# Patient Record
Sex: Male | Born: 1990
Health system: Southern US, Community
[De-identification: ages and names within clinical notes are randomized; demographics above are authoritative.]

---
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
## 2020-04-23 ENCOUNTER — Other Ambulatory Visit: Payer: Self-pay

## 2020-04-23 ENCOUNTER — Emergency Department (HOSPITAL_BASED_OUTPATIENT_CLINIC_OR_DEPARTMENT_OTHER): Payer: 59

## 2020-04-23 ENCOUNTER — Encounter (HOSPITAL_BASED_OUTPATIENT_CLINIC_OR_DEPARTMENT_OTHER): Payer: Self-pay

## 2020-04-23 DIAGNOSIS — R0789 Other chest pain: Secondary | ICD-10-CM | POA: Diagnosis not present

## 2020-04-23 LAB — BASIC METABOLIC PANEL
Anion gap: 12 (ref 5–15)
BUN: 15 mg/dL (ref 6–20)
CO2: 25 mmol/L (ref 22–32)
Calcium: 9.4 mg/dL (ref 8.9–10.3)
Chloride: 102 mmol/L (ref 98–111)
Creatinine, Ser: 1.13 mg/dL (ref 0.61–1.24)
GFR calc Af Amer: >60 mL/min (ref >60)
GFR calc non Af Amer: >60 mL/min (ref >60)
Glucose, Bld: 140 mg/dL — ABNORMAL HIGH (ref 70–99)
Potassium: 3.4 mmol/L — ABNORMAL LOW (ref 3.5–5.1)
Sodium: 139 mmol/L (ref 135–145)

## 2020-04-23 LAB — CBC
HCT: 46.3 % (ref 39.0–52.0)
Hemoglobin: 15.2 g/dL (ref 13.0–17.0)
MCH: 26.5 pg (ref 26.0–34.0)
MCHC: 32.8 g/dL (ref 30.0–36.0)
MCV: 80.8 fL (ref 80.0–100.0)
Platelets: 229 10*3/uL (ref 150–400)
RBC: 5.73 MIL/uL (ref 4.22–5.81)
RDW: 14.6 % (ref 11.5–15.5)
WBC: 7.5 10*3/uL (ref 4.0–10.5)
nRBC: 0 % (ref 0.0–0.2)

## 2020-04-23 MED ORDER — SODIUM CHLORIDE 0.9% FLUSH
3.0000 mL | Freq: Once | INTRAVENOUS | Status: DC
  Filled 2020-04-23: qty 3

## 2020-04-23 NOTE — ED Triage Notes (Signed)
Pt c/o CP x 4 days-denies fever/flu like sx-NAD-steady gait

## 2020-04-23 NOTE — ED Notes (Signed)
Patient transported to X-ray 

## 2020-04-24 ENCOUNTER — Emergency Department (HOSPITAL_BASED_OUTPATIENT_CLINIC_OR_DEPARTMENT_OTHER)
Admission: EM | Admit: 2020-04-24 | Discharge: 2020-04-24 | Disposition: A | Discharge status: Home / Self Care | Payer: 59 | Attending: Emergency Medicine | Admitting: Emergency Medicine

## 2020-04-24 DIAGNOSIS — R0789 Other chest pain: Secondary | ICD-10-CM

## 2020-04-24 LAB — D-DIMER, QUANTITATIVE: D-Dimer, Quant: <0.27 ug/mL-FEU (ref 0.00–0.50)

## 2020-04-24 LAB — LIPASE, BLOOD: Lipase: 25 U/L (ref 11–51)

## 2020-04-24 LAB — TROPONIN I (HIGH SENSITIVITY)
Troponin I (High Sensitivity): 2 ng/L (ref <18)
Troponin I (High Sensitivity): 2 ng/L (ref <18)

## 2020-04-24 LAB — HEPATIC FUNCTION PANEL
ALT: 53 U/L — ABNORMAL HIGH (ref 0–44)
AST: 32 U/L (ref 15–41)
Albumin: 4.8 g/dL (ref 3.5–5.0)
Alkaline Phosphatase: 58 U/L (ref 38–126)
Bilirubin, Direct: <0.1 mg/dL (ref 0.0–0.2)
Total Bilirubin: 0.6 mg/dL (ref 0.3–1.2)
Total Protein: 8.4 g/dL — ABNORMAL HIGH (ref 6.5–8.1)

## 2020-04-24 MED ORDER — NAPROXEN 500 MG PO TABS: 500.0000 mg | ORAL_TABLET | Freq: Two times a day (BID) | ORAL | 0 refills | Status: DC | PRN

## 2020-04-24 MED ORDER — LIDOCAINE VISCOUS HCL 2 % MT SOLN
15.0000 mL | Freq: Once | OROMUCOSAL | Status: AC
  Administered 2020-04-24: 15 mL via ORAL
  Filled 2020-04-24: qty 15

## 2020-04-24 MED ORDER — OMEPRAZOLE 20 MG PO CPDR: 20.0000 mg | DELAYED_RELEASE_CAPSULE | Freq: Every day | ORAL | 0 refills | Status: DC

## 2020-04-24 MED ORDER — ALUM & MAG HYDROXIDE-SIMETH 200-200-20 MG/5ML PO SUSP
30.0000 mL | Freq: Once | ORAL | Status: AC
  Administered 2020-04-24: 30 mL via ORAL
  Filled 2020-04-24: qty 30

## 2020-04-24 MED ORDER — KETOROLAC TROMETHAMINE 30 MG/ML IJ SOLN
30.0000 mg | Freq: Once | INTRAMUSCULAR | Status: AC
  Administered 2020-04-24: 30 mg via INTRAMUSCULAR
  Filled 2020-04-24: qty 1

## 2020-04-24 NOTE — ED Provider Notes (Signed)
[facility] EMERGENCY DEPARTMENT Provider Note   CSN: [id number] Arrival date & time: 04/23/20  2252     History Chief Complaint  Patient presents with  . Chest Pain    Devin Gibbs is a 29 y.o. male.  Patient with no medical history presenting with a 3-day history of constant chest pain is progressively worsening.  States the pain became more severe around 9 PM tonight which led to his ED visit.  The pain is in the center of his chest and epigastrium radiates to his back.  It is worse with palpation.  He has not had this kind of pain in the past.  He denies any shortness of breath, nausea, vomiting, diaphoresis.  Denies any abdominal pain.  No history of ulcers or acid reflux.  No cardiac history.  No pain with urination or blood in the urine. He has not tried any medication at home.  The pain is worse with palpation.  Not worse with movement.  No recent viral illness or fever.  The history is provided by the patient.  Chest Pain Associated symptoms: back pain   Associated symptoms: no abdominal pain, no dizziness, no fever, no headache, no nausea, no shortness of breath and no vomiting        History reviewed. No pertinent past medical history.  There are no problems to display for this patient.   History reviewed. No pertinent surgical history.     No family history on file.  Social History   Tobacco Use  . Smoking status: Never Smoker  . Smokeless tobacco: Never Used  Substance Use Topics  . Alcohol use: Yes    Comment: weekly  . Drug use: Never    Home Medications Prior to Admission medications   Not on File    Allergies    Shellfish allergy  Review of Systems   Review of Systems  Constitutional: Negative for activity change, appetite change and fever.  HENT: Negative for congestion and rhinorrhea.   Eyes: Negative for visual disturbance.  Respiratory: Positive for chest tightness. Negative for shortness of breath.   Cardiovascular:  Positive for chest pain.  Gastrointestinal: Negative for abdominal pain, nausea and vomiting.  Genitourinary: Negative for dysuria.  Musculoskeletal: Positive for back pain. Negative for arthralgias and myalgias.  Skin: Negative for rash.  Neurological: Negative for dizziness and headaches.   all other systems are negative except as noted in the HPI and PMH.    Physical Exam Updated Vital Signs BP 130/77   Pulse 75   Temp 98.7 F (37.1 C) (Oral)   Resp 18   Ht 5\' 7"  (1.702 m)   Wt 85.3 kg   SpO2 99%   BMI 29.44 kg/m   Physical Exam Vitals and nursing note reviewed.  Constitutional:      General: He is not in acute distress.    Appearance: He is well-developed. He is obese.  HENT:     Head: Normocephalic and atraumatic.     Mouth/Throat:     Pharynx: No oropharyngeal exudate.  Eyes:     Conjunctiva/sclera: Conjunctivae normal.     Pupils: Pupils are equal, round, and reactive to light.  Neck:     Comments: No meningismus. Cardiovascular:     Rate and Rhythm: Normal rate and regular rhythm.     Heart sounds: Normal heart sounds. No murmur.  Pulmonary:     Effort: Pulmonary effort is normal. No respiratory distress.     Breath sounds: Normal breath sounds.  Comments: Xiphoid tenderness Chest:     Chest wall: Tenderness present.  Abdominal:     Palpations: Abdomen is soft.     Tenderness: There is abdominal tenderness. There is no guarding or rebound.     Comments: Mild epigastric tender No RUQ tenderness. No guarding or rebound  Musculoskeletal:        General: No tenderness. Normal range of motion.     Cervical back: Normal range of motion and neck supple.     Comments: Paraspinal thoracic tenderness, no midline tenderness  Skin:    General: Skin is warm.     Capillary Refill: Capillary refill takes less than 2 seconds.  Neurological:     General: No focal deficit present.     Mental Status: He is alert and oriented to person, place, and time. Mental  status is at baseline.     Cranial Nerves: No cranial nerve deficit.     Motor: No abnormal muscle tone.     Coordination: Coordination normal.     Comments: No ataxia on finger to nose bilaterally. No pronator drift. 5/5 strength throughout. CN 2-12 intact.Equal grip strength. Sensation intact.   Psychiatric:        Behavior: Behavior normal.     ED Results / Procedures / Treatments   Labs (all labs ordered are listed, but only abnormal results are displayed) Labs Reviewed  BASIC METABOLIC PANEL - Abnormal; Notable for the following components:      Result Value   Potassium 3.4 (*)    Glucose, Bld 140 (*)    All other components within normal limits  HEPATIC FUNCTION PANEL - Abnormal; Notable for the following components:   Total Protein 8.4 (*)    ALT 53 (*)    All other components within normal limits  CBC  LIPASE, BLOOD  D-DIMER, QUANTITATIVE (NOT AT [facility])  TROPONIN I (HIGH SENSITIVITY)  TROPONIN I (HIGH SENSITIVITY)    EKG EKG Interpretation  Date/Time:  Monday Apr 23 2020 22:56:50 EDT Ventricular Rate:  109 PR Interval:  162 QRS Duration: 84 QT Interval:  308 QTC Calculation: 414 R Axis:   66 Text Interpretation: Sinus tachycardia Nonspecific T wave abnormality Abnormal ECG No previous ECGs available Confirmed by Glynn Octave [phone]) on 04/24/2020 12:22:32 AM   Radiology DG Chest 2 View  Result Date: 04/23/2020 CLINICAL DATA:  Chest pain EXAM: CHEST - 2 VIEW COMPARISON:  None. FINDINGS: The heart size and mediastinal contours are within normal limits. Both lungs are clear. The visualized skeletal structures are unremarkable. IMPRESSION: No active cardiopulmonary disease. Electronically Signed   By: Jonna Clark M.D.   On: 04/23/2020 23:08    Procedures Procedures (including critical care time)  Medications Ordered in ED Medications  sodium chloride flush (NS) 0.9 % injection 3 mL (has no administration in time range)  alum & mag hydroxide-simeth  (MAALOX/MYLANTA) 200-200-20 MG/5ML suspension 30 mL (30 mLs Oral Given 04/24/20 0114)    And  lidocaine (XYLOCAINE) 2 % viscous mouth solution 15 mL (15 mLs Oral Given 04/24/20 0115)  ketorolac (TORADOL) 30 MG/ML injection 30 mg (30 mg Intramuscular Given 04/24/20 0122)    ED Course  I have reviewed the triage vital signs and the nursing notes.  Pertinent labs & imaging results that were available during my care of the patient were reviewed by me and considered in my medical decision making (see chart for details).    MDM Rules/Calculators/A&P  4 days of constant chest pain that became acutely worse tonight.  It is worse with palpation and somewhat reproducible.  EKG shows no acute ischemia.  Troponin negative, chest x-ray negative.  Patient's pain is reproducible and worse with palpation.  There is no fever or recent illness.  Does not worsen with positional change.  Labs show negative troponin x2, normal LFTs, lipase and D-dimer.  Low suspicion for ACS or PE. Equal upper extremity blood pressures.  Negative D-dimer.  Low suspicion for aortic dissection.  We will treat with anti-inflammatories as well as prophylactic PPI.  Avoid alcohol, spicy foods, caffeine.  Establish care with PCP.  Return to the ED if chest pain becomes exertional, associated shortness of breath, nausea, vomiting, sweating, other concerns. Final Clinical Impression(s) / ED Diagnoses Final diagnoses:  Atypical chest pain    Rx / DC Orders ED Discharge Orders    None       Virgilio Broadhead, Annie Main, MD 04/24/20 [phone]

## 2020-04-24 NOTE — ED Notes (Signed)
Pt given a warm blanket x2

## 2020-04-24 NOTE — Discharge Instructions (Signed)
There is no evidence of heart attack or blood clot in the lung.  Take the anti-inflammatory as prescribed.  Avoid alcohol, caffeine, spicy foods.  Return to the ED for chest pain becomes exertional, associated shortness of breath, nausea, vomiting, sweating, any other concerns.

## 2020-09-27 DIAGNOSIS — Z20822 Contact with and (suspected) exposure to covid-19: Secondary | ICD-10-CM | POA: Diagnosis not present

## 2020-11-11 DIAGNOSIS — Z20822 Contact with and (suspected) exposure to covid-19: Secondary | ICD-10-CM | POA: Diagnosis not present

## 2021-02-11 DIAGNOSIS — Z20822 Contact with and (suspected) exposure to covid-19: Secondary | ICD-10-CM | POA: Diagnosis not present

## 2021-04-20 DIAGNOSIS — S239XXA Sprain of unspecified parts of thorax, initial encounter: Secondary | ICD-10-CM | POA: Diagnosis not present

## 2021-04-22 DIAGNOSIS — Z20822 Contact with and (suspected) exposure to covid-19: Secondary | ICD-10-CM | POA: Diagnosis not present

## 2021-05-02 ENCOUNTER — Encounter: Payer: Self-pay | Admitting: Medical

## 2021-05-02 ENCOUNTER — Ambulatory Visit: Payer: BLUE CROSS/BLUE SHIELD | Admitting: Medical

## 2021-05-02 ENCOUNTER — Other Ambulatory Visit: Payer: Self-pay

## 2021-05-02 VITALS — BP 112/78 | HR 84 | Ht 66.25 in | Wt 207.0 lb

## 2021-05-02 DIAGNOSIS — Z1159 Encounter for screening for other viral diseases: Secondary | ICD-10-CM

## 2021-05-02 DIAGNOSIS — Z13220 Encounter for screening for lipoid disorders: Secondary | ICD-10-CM | POA: Diagnosis not present

## 2021-05-02 DIAGNOSIS — Z6830 Body mass index (BMI) 30.0-30.9, adult: Secondary | ICD-10-CM

## 2021-05-02 DIAGNOSIS — R040 Epistaxis: Secondary | ICD-10-CM | POA: Diagnosis not present

## 2021-05-02 DIAGNOSIS — Z23 Encounter for immunization: Secondary | ICD-10-CM | POA: Diagnosis not present

## 2021-05-02 DIAGNOSIS — K219 Gastro-esophageal reflux disease without esophagitis: Secondary | ICD-10-CM

## 2021-05-02 DIAGNOSIS — Z0000 Encounter for general adult medical examination without abnormal findings: Secondary | ICD-10-CM

## 2021-05-02 DIAGNOSIS — R0789 Other chest pain: Secondary | ICD-10-CM

## 2021-05-02 DIAGNOSIS — Z113 Encounter for screening for infections with a predominantly sexual mode of transmission: Secondary | ICD-10-CM

## 2021-05-02 LAB — COMPREHENSIVE METABOLIC PANEL

## 2021-05-02 LAB — POCT URINALYSIS DIP (PROADVANTAGE DEVICE)
Bilirubin, UA: NEGATIVE
Blood, UA: NEGATIVE
Glucose, UA: NEGATIVE mg/dL
Ketones, POC UA: NEGATIVE mg/dL
Leukocytes, UA: NEGATIVE
Nitrite, UA: NEGATIVE
Protein Ur, POC: NEGATIVE mg/dL
Specific Gravity, Urine: 1.025
Urobilinogen, Ur: 0.2
pH, UA: 6 (ref 5.0–8.0)

## 2021-05-02 LAB — CBC WITH DIFFERENTIAL/PLATELET: Neutrophils Absolute: 1.9 10*3/uL (ref 1.4–7.0)

## 2021-05-02 NOTE — Progress Notes (Signed)
Subjective:   HPI  Devin Gibbs is a 30 y.o. male who presents for Chief Complaint  Patient presents with  . New Patient (Initial Visit)    Nose bleeds and headaches   Here as a new patient today accompanied by his male significant other  Here to establish care and for physical  He has had intermittent nosebleeds over the past several months.  They are infrequent, usually stops fairly quickly but still getting them somewhat regularly.  At times feels a little pressure in his chest.  He has gained weight and thinks is related to his weight gain.  He does exercise some.  Reviewed their medical, surgical, family, social, medication, and allergy history and updated chart as appropriate.  No past medical history on file.  No past surgical history on file.  No family history on file.  No current outpatient medications on file.  Allergies  Allergen Reactions  . Shellfish Allergy Anaphylaxis     Review of Systems Constitutional: -fever, -chills, -sweats, -unexpected weight change, -decreased appetite, -fatigue Allergy: -sneezing, -itching, -congestion Dermatology: -changing moles, --rash, -lumps ENT: -runny nose, -ear pain, -sore throat, -hoarseness, -sinus pain, -teeth pain, - ringing in ears, -hearing loss, +nosebleeds Cardiology: -chest pain, -palpitations, -swelling, -difficulty breathing when lying flat, -waking up short of breath Respiratory: -cough, -shortness of breath, -difficulty breathing with exercise or exertion, -wheezing, -coughing up blood Gastroenterology: -abdominal pain, -nausea, -vomiting, -diarrhea, -constipation, -blood in stool, -changes in bowel movement, -difficulty swallowing or eating Hematology: -bleeding, -bruising  Musculoskeletal: -joint aches, -muscle aches, -joint swelling, -back pain, -neck pain, -cramping, -changes in gait Ophthalmology: denies vision changes, eye redness, itching, discharge Urology: -burning with urination, -difficulty  urinating, -blood in urine, -urinary frequency, -urgency, -incontinence Neurology: -headache, -weakness, -tingling, -numbness, -memory loss, -falls, -dizziness Psychology: -depressed mood, -agitation, -sleep problems Male GU: no testicular mass, pain, no lymph nodes swollen, no swelling, no rash.  Depression screen [facility] 2/9 05/02/2021  Decreased Interest 0  Down, Depressed, Hopeless 0  PHQ - 2 Score 0        Objective:  BP 112/78   Pulse 84   Ht 5' 6.25" (1.683 m)   Wt 207 lb (93.9 kg)   SpO2 97%   BMI 33.16 kg/m   General appearance: alert, no distress, WD/WN, African American male Skin: Unremarkable HEENT: normocephalic, conjunctiva/corneas normal, sclerae anicteric, PERRLA, EOMi, nares patent, no discharge or erythema, pharynx normal Oral cavity: MMM, tongue normal, teeth normal Neck: supple, no lymphadenopathy, no thyromegaly, no masses, normal ROM, no bruits Chest: non tender, normal shape and expansion Heart: RRR, normal S1, S2, no murmurs Lungs: CTA bilaterally, no wheezes, rhonchi, or rales Abdomen: +bs, soft, non tender, non distended, no masses, no hepatomegaly, no splenomegaly, no bruits Back: non tender, normal ROM, no scoliosis Musculoskeletal: upper extremities non tender, no obvious deformity, normal ROM throughout, lower extremities non tender, no obvious deformity, normal ROM throughout Extremities: no edema, no cyanosis, no clubbing Pulses: 2+ symmetric, upper and lower extremities, normal cap refill Neurological: alert, oriented x 3, CN2-12 intact, strength normal upper extremities and lower extremities, sensation normal throughout, DTRs 2+ throughout, no cerebellar signs, gait normal Psychiatric: normal affect, behavior normal, pleasant  GU: normal male external genitalia,circumcised, nontender, no masses, no hernia, no lymphadenopathy Rectal: Deferred   Assessment and Plan :   Encounter Diagnoses  Name Primary?  . Encounter for health maintenance  examination in adult Yes  . Nosebleed   . Gastroesophageal reflux disease, unspecified whether esophagitis present   .  BMI 30.0-30.9,adult   . Screening for lipid disorders   . Screen for STD (sexually transmitted disease)   . Encounter for hepatitis C screening test for low risk patient   . Chest pressure   . Need for Tdap vaccination     This visit was a preventative care visit, also known as wellness visit or routine physical.   Topics typically include healthy lifestyle, diet, exercise, preventative care, vaccinations, sick and well care, proper use of emergency dept and after hours care, as well as other concerns.     Recommendations: Continue to return yearly for your annual wellness and preventative care visits.  This gives [place] a chance to discuss healthy lifestyle, exercise, vaccinations, review your chart record, and perform screenings where appropriate.  I recommend you see your eye doctor yearly for routine vision care.  I recommend you see your dentist yearly for routine dental care including hygiene visits twice yearly.   Vaccination recommendations were reviewed Immunization History  Administered Date(s) Administered  . PFIZER(Purple Top)SARS-COV-2 Vaccination 07/18/2020, 08/07/2020, 01/19/2021    Counseled on the Tdap (tetanus, diptheria, and acellular pertussis) vaccine.  Vaccine information sheet given. Tdap vaccine given after consent obtained.  Advised yearly flu shot in the fall    Screening for cancer: Colon cancer screening: Age 44   Testicular cancer screening You should do a monthly self testicular exam if you are between 57-67 years old  We discussed PSA, prostate exam, and prostate cancer screening risks/benefits.   Age 64  Skin cancer screening: Check your skin regularly for new changes, growing lesions, or other lesions of concern Come in for evaluation if you have skin lesions of concern.  Lung cancer screening: If you have a greater than  20 pack year history of tobacco use, then you may qualify for lung cancer screening with a chest CT scan.   Please call your insurance company to inquire about coverage for this test.  We currently don't have screenings for other cancers besides breast, cervical, colon, and lung cancers.  If you have a strong family history of cancer or have other cancer screening concerns, please let me know.    Bone health: Get at least 150 minutes of aerobic exercise weekly Get weight bearing exercise at least once weekly Bone density test:   A bone density test is an imaging test that uses a type of X-ray to measure the amount of calcium and other minerals in your bones.  The test may be used to diagnose or screen you for a condition that causes weak or thin bones (osteoporosis), predict your risk for a broken bone (fracture), or determine how well your osteoporosis treatment is working. The bone density test is recommended for females 65 and older, or females or males <65 if certain risk factors such as thyroid disease, long term use of steroids such as for asthma or rheumatological issues, vitamin D deficiency, estrogen deficiency, family history of osteoporosis, self or family history of fragility fracture in first degree relative.    Heart health: Get at least 150 minutes of aerobic exercise weekly Limit alcohol It is important to maintain a healthy blood pressure and healthy cholesterol numbers  Heart disease screening: Screening for heart disease includes screening for blood pressure, fasting lipids, glucose/diabetes screening, BMI height to weight ratio, reviewed of smoking status, physical activity, and diet.    Goals include blood pressure 120/80 or less, maintaining a healthy lipid/cholesterol profile, preventing diabetes or keeping diabetes numbers under good control, not smoking  or using tobacco products, exercising most days per week or at least 150 minutes per week of exercise, and eating  healthy variety of fruits and vegetables, healthy oils, and avoiding unhealthy food choices like fried food, fast food, high sugar and high cholesterol foods.    Other tests may possibly include EKG test, CT coronary calcium score, echocardiogram, exercise treadmill stress test.    Medical care options: I recommend you continue to seek care here first for routine care.  We try really hard to have available appointments Monday through Friday daytime hours for sick visits, acute visits, and physicals.  Urgent care should be used for after hours and weekends for significant issues that cannot wait till the next day.  The emergency department should be used for significant potentially life-threatening emergencies.  The emergency department is expensive, can often have long wait times for less significant concerns, so try to utilize primary care, urgent care, or telemedicine when possible to avoid unnecessary trips to the emergency department.  Virtual visits and telemedicine have been introduced since the pandemic started in 2020, and can be convenient ways to receive medical care.  We offer virtual appointments as well to assist you in a variety of options to seek medical care.    Separate significant issues discussed: Nosebleeds-advised the use nosebleed plugs over-the-counter and hold pressure for 8 to 10 minutes for nosebleeds.  If he continues to get these and follow-up.  Lab screen today  BMI greater than 30-counseled on the need to lose weight through healthy diet and exercise.  I suspect his weight gain is giving him the pressure or short of breath feeling he gets at times, related to deconditioning.  Work on weight loss, healthy lifestyle  limit alcohol   Devin Gibbs was seen today for new patient (initial visit).  Diagnoses and all orders for this visit:  Encounter for health maintenance examination in adult -     Comprehensive metabolic panel -     CBC with Differential/Platelet -      Lipid panel -     TSH -     POCT Urinalysis DIP (Proadvantage Device) -     HIV Antibody (routine testing w rflx) -     RPR -     GC/Chlamydia Probe Amp -     Hepatitis C antibody  Nosebleed -     CBC with Differential/Platelet  Gastroesophageal reflux disease, unspecified whether esophagitis present  BMI 30.0-30.9,adult  Screening for lipid disorders -     Lipid panel  Screen for STD (sexually transmitted disease) -     HIV Antibody (routine testing w rflx) -     RPR -     GC/Chlamydia Probe Amp -     Hepatitis C antibody  Encounter for hepatitis C screening test for low risk patient -     Hepatitis C antibody  Chest pressure  Need for Tdap vaccination  Other orders -     Tdap vaccine greater than or equal to 7yo IM    Follow-up pending labs, yearly for physical

## 2021-05-02 NOTE — Patient Instructions (Signed)
This visit was a preventative care visit, also known as wellness visit or routine physical.   Topics typically include healthy lifestyle, diet, exercise, preventative care, vaccinations, sick and well care, proper use of emergency dept and after hours care, as well as other concerns.     Recommendations: Continue to return yearly for your annual wellness and preventative care visits.  This gives [place] a chance to discuss healthy lifestyle, exercise, vaccinations, review your chart record, and perform screenings where appropriate.  I recommend you see your eye doctor yearly for routine vision care.  I recommend you see your dentist yearly for routine dental care including hygiene visits twice yearly.   Vaccination recommendations were reviewed Immunization History  Administered Date(s) Administered  . PFIZER(Purple Top)SARS-COV-2 Vaccination 07/18/2020, 08/07/2020, 01/19/2021    Counseled on the Tdap (tetanus, diptheria, and acellular pertussis) vaccine.  Vaccine information sheet given. Tdap vaccine given after consent obtained.  Advised yearly flu shot in the fall    Screening for cancer: Colon cancer screening: Age 32   Testicular cancer screening You should do a monthly self testicular exam if you are between 32-32 years old  We discussed PSA, prostate exam, and prostate cancer screening risks/benefits.   Age 30  Skin cancer screening: Check your skin regularly for new changes, growing lesions, or other lesions of concern Come in for evaluation if you have skin lesions of concern.  Lung cancer screening: If you have a greater than 20 pack year history of tobacco use, then you may qualify for lung cancer screening with a chest CT scan.   Please call your insurance company to inquire about coverage for this test.  We currently don't have screenings for other cancers besides breast, cervical, colon, and lung cancers.  If you have a strong family history of cancer or have other  cancer screening concerns, please let me know.    Bone health: Get at least 150 minutes of aerobic exercise weekly Get weight bearing exercise at least once weekly Bone density test:   A bone density test is an imaging test that uses a type of X-ray to measure the amount of calcium and other minerals in your bones.  The test may be used to diagnose or screen you for a condition that causes weak or thin bones (osteoporosis), predict your risk for a broken bone (fracture), or determine how well your osteoporosis treatment is working. The bone density test is recommended for females 65 and older, or females or males <65 if certain risk factors such as thyroid disease, long term use of steroids such as for asthma or rheumatological issues, vitamin D deficiency, estrogen deficiency, family history of osteoporosis, self or family history of fragility fracture in first degree relative.    Heart health: Get at least 150 minutes of aerobic exercise weekly Limit alcohol It is important to maintain a healthy blood pressure and healthy cholesterol numbers  Heart disease screening: Screening for heart disease includes screening for blood pressure, fasting lipids, glucose/diabetes screening, BMI height to weight ratio, reviewed of smoking status, physical activity, and diet.    Goals include blood pressure 120/80 or less, maintaining a healthy lipid/cholesterol profile, preventing diabetes or keeping diabetes numbers under good control, not smoking or using tobacco products, exercising most days per week or at least 150 minutes per week of exercise, and eating healthy variety of fruits and vegetables, healthy oils, and avoiding unhealthy food choices like fried food, fast food, high sugar and high cholesterol foods.  Other tests may possibly include EKG test, CT coronary calcium score, echocardiogram, exercise treadmill stress test.    Medical care options: I recommend you continue to seek care here  first for routine care.  We try really hard to have available appointments Monday through Friday daytime hours for sick visits, acute visits, and physicals.  Urgent care should be used for after hours and weekends for significant issues that cannot wait till the next day.  The emergency department should be used for significant potentially life-threatening emergencies.  The emergency department is expensive, can often have long wait times for less significant concerns, so try to utilize primary care, urgent care, or telemedicine when possible to avoid unnecessary trips to the emergency department.  Virtual visits and telemedicine have been introduced since the pandemic started in 2020, and can be convenient ways to receive medical care.  We offer virtual appointments as well to assist you in a variety of options to seek medical care.    Separate significant issues discussed: Nosebleeds-advised the use nosebleed plugs over-the-counter and hold pressure for 8 to 10 minutes for nosebleeds.  If he continues to get these and follow-up.  Lab screen today  BMI greater than 30-counseled on the need to lose weight through healthy diet and exercise.  I suspect his weight gain is giving him the pressure or short of breath feeling he gets at times, related to deconditioning.  Work on weight loss, healthy lifestyle  limit alcohol

## 2021-05-03 LAB — CBC WITH DIFFERENTIAL/PLATELET
Basophils Absolute: 0 10*3/uL (ref 0.0–0.2)
Basos: 1 %
EOS (ABSOLUTE): 0.2 10*3/uL (ref 0.0–0.4)
Eos: 4 %
Hematocrit: 44.1 % (ref 37.5–51.0)
Hemoglobin: 15.1 g/dL (ref 13.0–17.7)
Immature Grans (Abs): 0 10*3/uL (ref 0.0–0.1)
Immature Granulocytes: 0 %
Lymphocytes Absolute: 3.2 10*3/uL — ABNORMAL HIGH (ref 0.7–3.1)
Lymphs: 54 %
MCH: 26 pg — ABNORMAL LOW (ref 26.6–33.0)
MCHC: 34.2 g/dL (ref 31.5–35.7)
MCV: 76 fL — ABNORMAL LOW (ref 79–97)
Monocytes Absolute: 0.5 10*3/uL (ref 0.1–0.9)
Monocytes: 8 %
Neutrophils: 33 %
Platelets: 283 10*3/uL (ref 150–450)
RBC: 5.8 x10E6/uL (ref 4.14–5.80)
RDW: 14.6 % (ref 11.6–15.4)
WBC: 5.8 10*3/uL (ref 3.4–10.8)

## 2021-05-03 LAB — TSH: TSH: 0.893 u[IU]/mL (ref 0.450–4.500)

## 2021-05-03 LAB — LIPID PANEL
Chol/HDL Ratio: 4 ratio (ref 0.0–5.0)
Cholesterol, Total: 186 mg/dL (ref 100–199)
HDL: 47 mg/dL (ref >39)
LDL Chol Calc (NIH): 123 mg/dL — ABNORMAL HIGH (ref 0–99)
Triglycerides: 85 mg/dL (ref 0–149)
VLDL Cholesterol Cal: 16 mg/dL (ref 5–40)

## 2021-05-03 LAB — RPR: RPR Ser Ql: NONREACTIVE

## 2021-05-03 LAB — COMPREHENSIVE METABOLIC PANEL
ALT: 79 IU/L — ABNORMAL HIGH (ref 0–44)
AST: 26 IU/L (ref 0–40)
Albumin/Globulin Ratio: 2 (ref 1.2–2.2)
Albumin: 5 g/dL (ref 4.0–5.0)
Alkaline Phosphatase: 56 IU/L (ref 44–121)
BUN/Creatinine Ratio: 14 (ref 9–20)
BUN: 15 mg/dL (ref 6–20)
Bilirubin Total: 0.3 mg/dL (ref 0.0–1.2)
CO2: 20 mmol/L (ref 20–29)
Calcium: 10 mg/dL (ref 8.7–10.2)
Chloride: 100 mmol/L (ref 96–106)
Creatinine, Ser: 1.1 mg/dL (ref 0.76–1.27)
Glucose: 84 mg/dL (ref 65–99)
Potassium: 4.2 mmol/L (ref 3.5–5.2)
Sodium: 139 mmol/L (ref 134–144)
Total Protein: 7.5 g/dL (ref 6.0–8.5)
eGFR: 92 mL/min/{1.73_m2} (ref >59)

## 2021-05-03 LAB — HEPATITIS C ANTIBODY: Hep C Virus Ab: <0.1 s/co ratio (ref 0.0–0.9)

## 2021-05-03 LAB — HIV ANTIBODY (ROUTINE TESTING W REFLEX): HIV Screen 4th Generation wRfx: NONREACTIVE

## 2021-05-04 LAB — GC/CHLAMYDIA PROBE AMP
Chlamydia trachomatis, NAA: NEGATIVE
Neisseria Gonorrhoeae by PCR: NEGATIVE

## 2021-06-19 ENCOUNTER — Ambulatory Visit: Payer: BLUE CROSS/BLUE SHIELD | Admitting: Medical

## 2021-12-05 IMAGING — CR DG CHEST 2V
2 series · 2 of 2 positions shown · non-contrast
Comparison: None.

CLINICAL DATA: Chest pain

EXAM:
CHEST - 2 VIEW

[w chest pa]
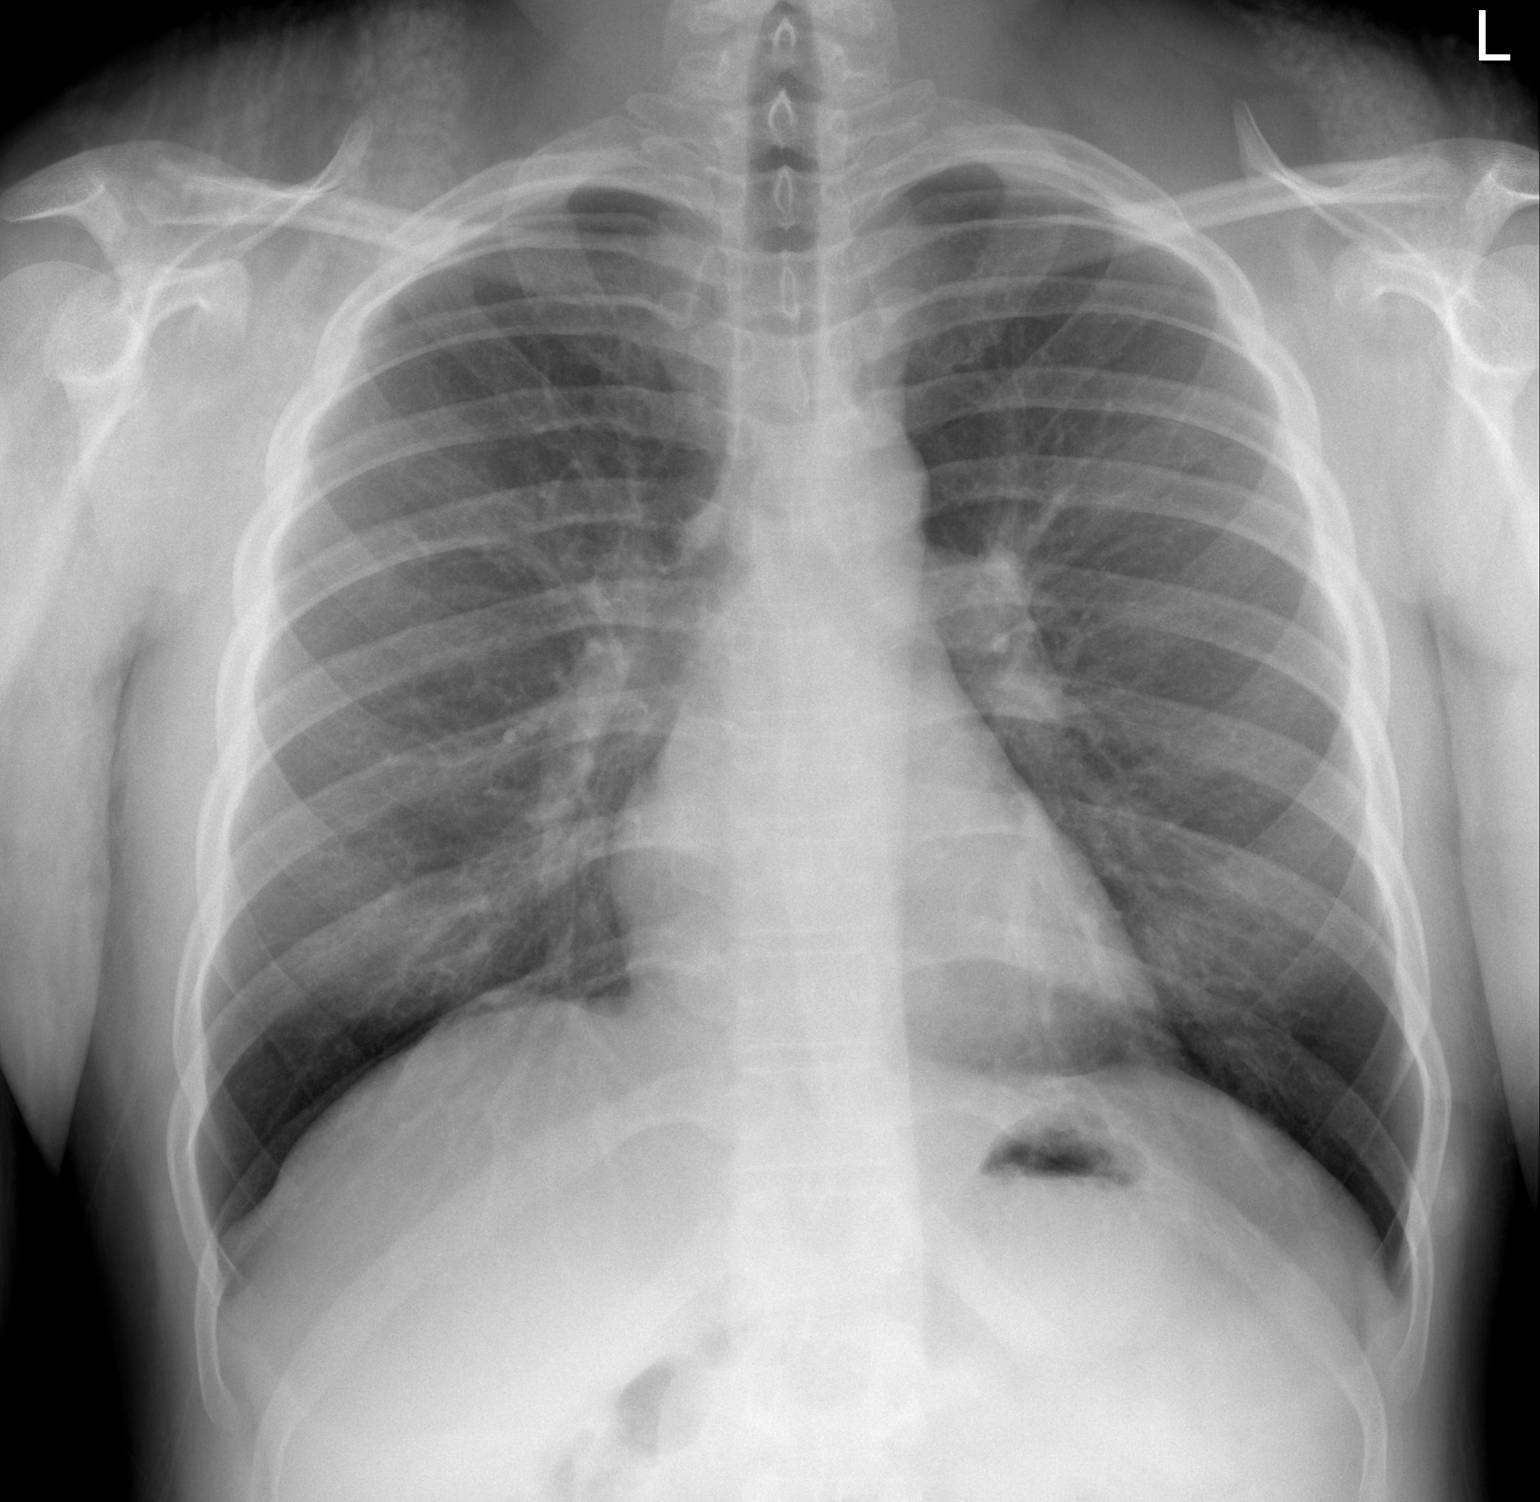

[w chest lat]
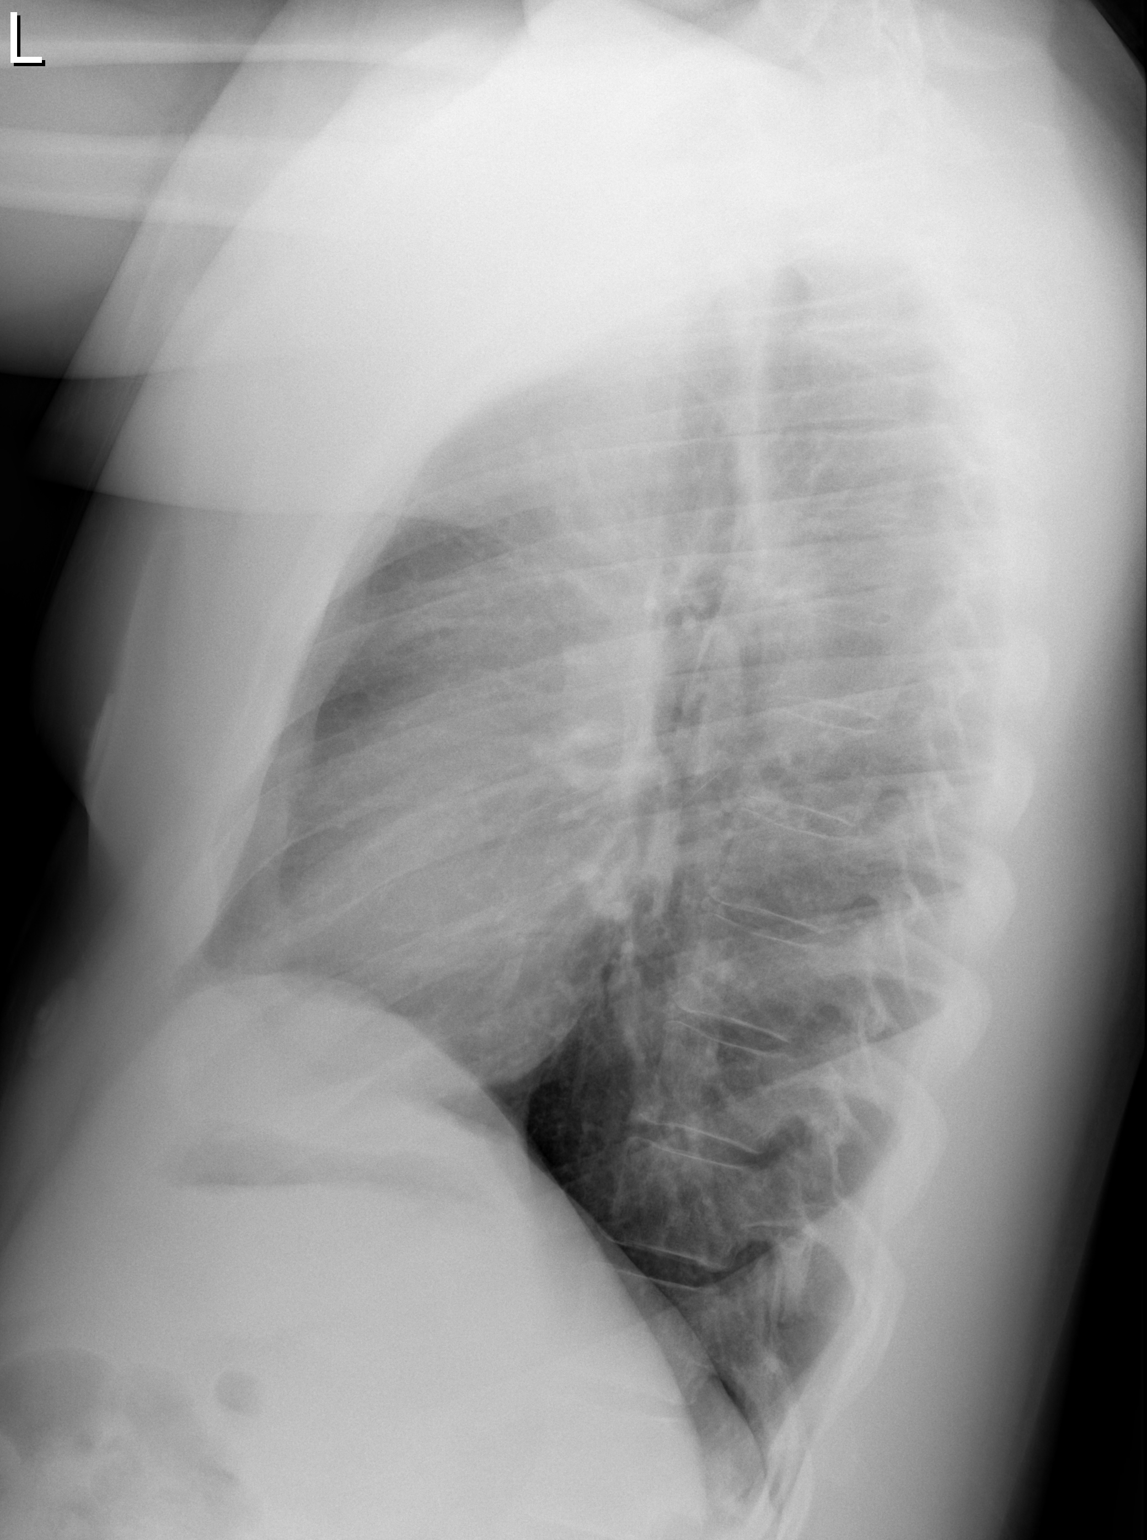

[2 of 2 positions shown; findings below may reference images not displayed]

FINDINGS: The heart size and mediastinal contours are within normal limits.
Both lungs are clear. The visualized skeletal structures are
unremarkable.
IMPRESSION: No active cardiopulmonary disease.

## 2022-05-15 HISTORY — PX: NO PAST SURGERIES: SHX2092

## 2022-05-23 ENCOUNTER — Other Ambulatory Visit: Payer: Self-pay

## 2022-05-23 ENCOUNTER — Encounter: Payer: Self-pay | Admitting: Medical

## 2022-05-23 ENCOUNTER — Ambulatory Visit (INDEPENDENT_AMBULATORY_CARE_PROVIDER_SITE_OTHER): Payer: 59 | Admitting: Medical

## 2022-05-23 VITALS — BP 120/72 | HR 86 | Ht 67.0 in | Wt 205.2 lb

## 2022-05-23 DIAGNOSIS — R42 Dizziness and giddiness: Secondary | ICD-10-CM

## 2022-05-23 DIAGNOSIS — L989 Disorder of the skin and subcutaneous tissue, unspecified: Secondary | ICD-10-CM

## 2022-05-23 DIAGNOSIS — Z13220 Encounter for screening for lipoid disorders: Secondary | ICD-10-CM | POA: Diagnosis not present

## 2022-05-23 DIAGNOSIS — Z3141 Encounter for fertility testing: Secondary | ICD-10-CM

## 2022-05-23 DIAGNOSIS — R5383 Other fatigue: Secondary | ICD-10-CM

## 2022-05-23 DIAGNOSIS — Z0000 Encounter for general adult medical examination without abnormal findings: Secondary | ICD-10-CM | POA: Diagnosis not present

## 2022-05-23 DIAGNOSIS — R21 Rash and other nonspecific skin eruption: Secondary | ICD-10-CM

## 2022-05-23 MED ORDER — GRISEOFULVIN ULTRAMICROSIZE 250 MG PO TABS: 250.0000 mg | ORAL_TABLET | Freq: Two times a day (BID) | ORAL | 1 refills | Status: DC

## 2022-05-23 MED ORDER — HYDROCORTISONE 0.5 % EX OINT: 1.0000 "application " | TOPICAL_OINTMENT | Freq: Every day | CUTANEOUS | 0 refills | Status: DC

## 2022-05-23 NOTE — Progress Notes (Signed)
Subjective:   HPI  Devin Gibbs is a 31 y.o. male who presents for Chief Complaint  Patient presents with   cpe    Fasting cpe, possible ezcema on scalp and behind the ears. Wants to know about sperm count. Having fatigue   Concerns today include irritation behind the ears possible eczema, he also has some irritation of the scalp.  This has been on a while.  He sometimes gets lightheaded during sex.  He is not exercising really so he is not sure if he would get lightheaded with other exercise.  He would like semen analysis.   Reviewed their medical, surgical, family, social, medication, and allergy history and updated chart as appropriate.  History reviewed. No pertinent past medical history.  Past Surgical History:  Procedure Laterality Date   NO PAST SURGERIES  05/2022    Family History  Problem Relation Age of Onset   Cancer Neg Hx    Heart disease Neg Hx    Hypertension Neg Hx    Stroke Neg Hx    Diabetes Neg Hx      Current Outpatient Medications:    griseofulvin (GRIS-PEG) 250 MG tablet, Take 1 tablet (250 mg total) by mouth 2 (two) times daily., Disp: 60 tablet, Rfl: 1   hydrocortisone ointment 0.5 %, Apply 1 application  topically daily., Disp: 30 g, Rfl: 0  Allergies  Allergen Reactions   Shellfish Allergy Anaphylaxis     Review of Systems Constitutional: -fever, -chills, -sweats, -unexpected weight change, -decreased appetite, -fatigue Allergy: -sneezing, -itching, -congestion Dermatology: -changing moles, --rash, -lumps ENT: -runny nose, -ear pain, -sore throat, -hoarseness, -sinus pain, -teeth pain, - ringing in ears, -hearing loss, -nosebleeds Cardiology: -chest pain, -palpitations, -swelling, -difficulty breathing when lying flat, -waking up short of breath Respiratory: -cough, -shortness of breath, -difficulty breathing with exercise or exertion, -wheezing, -coughing up blood Gastroenterology: -abdominal pain, -nausea, -vomiting, -diarrhea,  -constipation, -blood in stool, -changes in bowel movement, -difficulty swallowing or eating Hematology: -bleeding, -bruising  Musculoskeletal: -joint aches, -muscle aches, -joint swelling, -back pain, -neck pain, -cramping, -changes in gait Ophthalmology: denies vision changes, eye redness, itching, discharge Urology: -burning with urination, -difficulty urinating, -blood in urine, -urinary frequency, -urgency, -incontinence Neurology: -headache, -weakness, -tingling, -numbness, -memory loss, -falls, -dizziness Psychology: -depressed mood, -agitation, -sleep problems Male GU: no testicular mass, pain, no lymph nodes swollen, no swelling, no rash.     05/23/2022    3:15 PM 05/02/2021    1:02 PM  Depression screen PHQ 2/9  Decreased Interest 0 0  Down, Depressed, Hopeless 0 0  PHQ - 2 Score 0 0        Objective:  BP 120/72   Pulse 86   Ht 5\' 7"  (1.702 m)   Wt 205 lb 3.2 oz (93.1 kg)   BMI 32.14 kg/m   General appearance: alert, no distress, WD/WN, African American male Skin: There is some discoloration hyperpigmentation within the scalp, he has dreadlocks, there is some rough skin patches irritation behind both ears but not erythema.  Suggestive of seborrheic dermatitis.  Otherwise unremarkable HEENT: normocephalic, conjunctiva/corneas normal, sclerae anicteric, PERRLA, EOMi, nares patent, no discharge or erythema, pharynx normal Oral cavity: MMM, tongue normal, teeth normal Neck: supple, no lymphadenopathy, no thyromegaly, no masses, normal ROM, no bruits Chest: non tender, normal shape and expansion Heart: RRR, normal S1, S2, no murmurs Lungs: CTA bilaterally, no wheezes, rhonchi, or rales Abdomen: +bs, soft, non tender, non distended, no masses, no hepatomegaly, no splenomegaly, no bruits Back: non  tender, normal ROM, no scoliosis Musculoskeletal: upper extremities non tender, no obvious deformity, normal ROM throughout, lower extremities non tender, no obvious deformity, normal  ROM throughout Extremities: no edema, no cyanosis, no clubbing Pulses: 2+ symmetric, upper and lower extremities, normal cap refill Neurological: alert, oriented x 3, CN2-12 intact, strength normal upper extremities and lower extremities, sensation normal throughout, DTRs 2+ throughout, no cerebellar signs, gait normal Psychiatric: normal affect, behavior normal, pleasant  GU: normal male external genitalia,circumcised, nontender, no masses, no hernia, no lymphadenopathy Rectal: Deferred   Assessment and Plan :   Encounter Diagnoses  Name Primary?   Encounter for health maintenance examination in adult Yes   Other fatigue    Screening for lipid disorders    Lightheaded    Encounter for semen analysis    Rash    Dermatosis of scalp     This visit was a preventative care visit, also known as wellness visit or routine physical.   Topics typically include healthy lifestyle, diet, exercise, preventative care, vaccinations, sick and well care, proper use of emergency dept and after hours care, as well as other concerns.     Recommendations: Continue to return yearly for your annual wellness and preventative care visits.  This gives [place] a chance to discuss healthy lifestyle, exercise, vaccinations, review your chart record, and perform screenings where appropriate.  I recommend you see your eye doctor yearly for routine vision care.  I recommend you see your dentist yearly for routine dental care including hygiene visits twice yearly.   Vaccination recommendations were reviewed Immunization History  Administered Date(s) Administered   PFIZER(Purple Top)SARS-COV-2 Vaccination 07/18/2020, 08/07/2020, 01/19/2021   Tdap 05/02/2021    Advised yearly flu shot in the fall   Screening for cancer: Colon cancer screening: Age 32  Testicular cancer screening You should do a monthly self testicular exam if you are between 17-79 years old  We discussed PSA, prostate exam, and prostate  cancer screening risks/benefits.   Age 36  Skin cancer screening: Check your skin regularly for new changes, growing lesions, or other lesions of concern Come in for evaluation if you have skin lesions of concern.  Lung cancer screening: If you have a greater than 20 pack year history of tobacco use, then you may qualify for lung cancer screening with a chest CT scan.   Please call your insurance company to inquire about coverage for this test.  We currently don't have screenings for other cancers besides breast, cervical, colon, and lung cancers.  If you have a strong family history of cancer or have other cancer screening concerns, please let me know.    Bone health: Get at least 150 minutes of aerobic exercise weekly Get weight bearing exercise at least once weekly Bone density test:  A bone density test is an imaging test that uses a type of X-ray to measure the amount of calcium and other minerals in your bones. The test may be used to diagnose or screen you for a condition that causes weak or thin bones (osteoporosis), predict your risk for a broken bone (fracture), or determine how well your osteoporosis treatment is working. The bone density test is recommended for females 65 and older, or females or males <65 if certain risk factors such as thyroid disease, long term use of steroids such as for asthma or rheumatological issues, vitamin D deficiency, estrogen deficiency, family history of osteoporosis, self or family history of fragility fracture in first degree relative.    Heart health:  Get at least 150 minutes of aerobic exercise weekly Limit alcohol It is important to maintain a healthy blood pressure and healthy cholesterol numbers  Heart disease screening: Screening for heart disease includes screening for blood pressure, fasting lipids, glucose/diabetes screening, BMI height to weight ratio, reviewed of smoking status, physical activity, and diet.    Goals include blood  pressure 120/80 or less, maintaining a healthy lipid/cholesterol profile, preventing diabetes or keeping diabetes numbers under good control, not smoking or using tobacco products, exercising most days per week or at least 150 minutes per week of exercise, and eating healthy variety of fruits and vegetables, healthy oils, and avoiding unhealthy food choices like fried food, fast food, high sugar and high cholesterol foods.     Medical care options: I recommend you continue to seek care here first for routine care.  We try really hard to have available appointments Monday through Friday daytime hours for sick visits, acute visits, and physicals.  Urgent care should be used for after hours and weekends for significant issues that cannot wait till the next day.  The emergency department should be used for significant potentially life-threatening emergencies.  The emergency department is expensive, can often have long wait times for less significant concerns, so try to utilize primary care, urgent care, or telemedicine when possible to avoid unnecessary trips to the emergency department.  Virtual visits and telemedicine have been introduced since the pandemic started in 2020, and can be convenient ways to receive medical care.  We offer virtual appointments as well to assist you in a variety of options to seek medical care.    Separate significant issues discussed: BMI greater than 30-work on losing weight through healthy diet and exercise  Scalp irritation in rash behind the ears suggestive of seborrheic dermatosis.  Advised over-the-counter Delsym blue shampoo periodically, begin oral medication as below, can use a topical cream listed below for irritations behind the ears.  Lightheaded and fatigue-EKG reviewed today, labs today  Sperm analysis-he will turn in the sample at the designated lab as discussed today   Pinkney was seen today for cpe.  Diagnoses and all orders for this visit:  Encounter  for health maintenance examination in adult -     Comprehensive metabolic panel -     CBC with Differential/Platelet -     Lipid panel -     Testosterone -     EKG 12-Lead  Other fatigue -     Testosterone  Screening for lipid disorders -     Lipid panel  Lightheaded -     EKG 12-Lead  Encounter for semen analysis -     Semen Analysis, Basic; Future  Rash  Dermatosis of scalp  Other orders -     griseofulvin (GRIS-PEG) 250 MG tablet; Take 1 tablet (250 mg total) by mouth 2 (two) times daily. -     hydrocortisone ointment 0.5 %; Apply 1 application  topically daily.   Follow-up pending labs, yearly for physical

## 2022-05-24 LAB — LIPID PANEL
Chol/HDL Ratio: 4.2 ratio (ref 0.0–5.0)
Cholesterol, Total: 188 mg/dL (ref 100–199)
HDL: 45 mg/dL (ref >39)
LDL Chol Calc (NIH): 126 mg/dL — ABNORMAL HIGH (ref 0–99)
Triglycerides: 91 mg/dL (ref 0–149)
VLDL Cholesterol Cal: 17 mg/dL (ref 5–40)

## 2022-05-24 LAB — CBC WITH DIFFERENTIAL/PLATELET
Basophils Absolute: 0 10*3/uL (ref 0.0–0.2)
Basos: 1 %
EOS (ABSOLUTE): 0.3 10*3/uL (ref 0.0–0.4)
Eos: 5 %
Hematocrit: 45.8 % (ref 37.5–51.0)
Hemoglobin: 15.7 g/dL (ref 13.0–17.7)
Immature Grans (Abs): 0 10*3/uL (ref 0.0–0.1)
Immature Granulocytes: 0 %
Lymphocytes Absolute: 3 10*3/uL (ref 0.7–3.1)
Lymphs: 49 %
MCH: 26 pg — ABNORMAL LOW (ref 26.6–33.0)
MCHC: 34.3 g/dL (ref 31.5–35.7)
MCV: 76 fL — ABNORMAL LOW (ref 79–97)
Monocytes Absolute: 0.5 10*3/uL (ref 0.1–0.9)
Monocytes: 9 %
Neutrophils Absolute: 2.2 10*3/uL (ref 1.4–7.0)
Neutrophils: 36 %
Platelets: 229 10*3/uL (ref 150–450)
RBC: 6.04 x10E6/uL — ABNORMAL HIGH (ref 4.14–5.80)
RDW: 15.1 % (ref 11.6–15.4)
WBC: 6.1 10*3/uL (ref 3.4–10.8)

## 2022-05-24 LAB — COMPREHENSIVE METABOLIC PANEL
ALT: 39 IU/L (ref 0–44)
AST: 25 IU/L (ref 0–40)
Albumin/Globulin Ratio: 2.2 (ref 1.2–2.2)
Albumin: 5.1 g/dL — ABNORMAL HIGH (ref 4.0–5.0)
Alkaline Phosphatase: 50 IU/L (ref 44–121)
BUN/Creatinine Ratio: 13 (ref 9–20)
BUN: 16 mg/dL (ref 6–20)
Bilirubin Total: 0.3 mg/dL (ref 0.0–1.2)
CO2: 25 mmol/L (ref 20–29)
Calcium: 10.1 mg/dL (ref 8.7–10.2)
Chloride: 101 mmol/L (ref 96–106)
Creatinine, Ser: 1.28 mg/dL — ABNORMAL HIGH (ref 0.76–1.27)
Globulin, Total: 2.3 g/dL (ref 1.5–4.5)
Glucose: 84 mg/dL (ref 70–99)
Potassium: 4.3 mmol/L (ref 3.5–5.2)
Sodium: 140 mmol/L (ref 134–144)
Total Protein: 7.4 g/dL (ref 6.0–8.5)
eGFR: 76 mL/min/{1.73_m2} (ref >59)

## 2022-05-24 LAB — TESTOSTERONE: Testosterone: 294 ng/dL (ref 264–916)

## 2022-06-02 LAB — SEMEN ANALYSIS, BASIC
Appearance: NORMAL
Concentration, Sperm: 33.5 x10E6/mL (ref >14.9)
Immotile Sperm: 40 %
Leukocyte Concentration: <1 x10E6/mL (ref <1.00)
Liquefaction: 0
Non-Progressive (NP): 16 %
Normal Morphology-Strict: 10 % (ref >3)
Progressive Motility (PR): 44 % (ref >31)
Progressively Motile Sperm: 55.5 x10E6
Time Since Last Emission: 3 days
Total Motile Sperm: 76.5 x10E6
Total Motility (PR+NP): 60 % (ref >39)
Total Sperm in Ejaculate: 127.1 x10E6 (ref >38.9)
Viscosity: NORMAL
Volume: 3.8 mL (ref >1.4)
pH: 8.5 (ref >7.1)

## 2022-06-23 ENCOUNTER — Ambulatory Visit: Payer: 59 | Admitting: Medical

## 2022-06-23 ENCOUNTER — Encounter: Payer: Self-pay | Admitting: Medical

## 2022-06-23 VITALS — BP 120/80 | HR 78 | Wt 209.8 lb

## 2022-06-23 DIAGNOSIS — R718 Other abnormality of red blood cells: Secondary | ICD-10-CM | POA: Diagnosis not present

## 2022-06-23 DIAGNOSIS — R6882 Decreased libido: Secondary | ICD-10-CM | POA: Diagnosis not present

## 2022-06-23 DIAGNOSIS — R5383 Other fatigue: Secondary | ICD-10-CM | POA: Diagnosis not present

## 2022-06-23 DIAGNOSIS — R7989 Other specified abnormal findings of blood chemistry: Secondary | ICD-10-CM

## 2022-06-23 DIAGNOSIS — L989 Disorder of the skin and subcutaneous tissue, unspecified: Secondary | ICD-10-CM

## 2022-06-23 NOTE — Progress Notes (Signed)
Subjective:  Devin Gibbs is a 31 y.o. male who presents for Chief Complaint  Patient presents with   Follow-up     1 month follow up. No other concerns     Here for follow-up from last visit.   Accompanied by his spouse.  He has physical visit on May 23, 2022.  At that time there were several concerns including blood test showing elevated red cells, elevated creatinine.  We also started Griseofulvin for suspected fungal dermatitis of the scalp.  After a week of using the Griseofulvin, he discontinued this due to headaches . Instead has been using Selsun Blue shampoo which seems to be helping.    Last visit wanted testosterone level checked due fatigue and concerns.   Since level came back on low end, wants to recheck testosterone  Here to discuss labs from last visit, elevated RBC and elevated creatinine.    No NSAID use, nonsmoker, no loud snoring.  No other aggravating or relieving factors.    No other c/o.  No past medical history on file.  Current Outpatient Medications on File Prior to Visit  Medication Sig Dispense Refill   griseofulvin (GRIS-PEG) 250 MG tablet Take 1 tablet (250 mg total) by mouth 2 (two) times daily. (Patient not taking: Reported on 06/23/2022) 60 tablet 1   hydrocortisone ointment 0.5 % Apply 1 application  topically daily. (Patient not taking: Reported on 06/23/2022) 30 g 0   No current facility-administered medications on file prior to visit.     The following portions of the patient's history were reviewed and updated as appropriate: allergies, current medications, past family history, past medical history, past social history, past surgical history and problem list.  ROS Otherwise as in subjective above  Objective: BP 120/80   Pulse 78   Wt 209 lb 12.8 oz (95.2 kg)   SpO2 98%   BMI 32.86 kg/m   General appearance: alert, no distress, well developed, well nourished Otherwise not examined    Assessment: Encounter Diagnoses  Name  Primary?   Elevated serum creatinine Yes   Elevated red blood cell count    Other fatigue    Libido, decreased    Dermatosis of scalp      Plan: We discussed his labs from last visit.  We discussed elevated serum creatinine.  Recheck labs today.  Avoid NSAIDs, hydrate well in general.  We discussed his slightly elevated red cells from last visit compared to prior years which were normal.  We discussed possible causes of elevated red cells.  Recheck labs today.  I suspect he was little dehydrated on his last lab visit which could be causing the creatinine and red cell changing labs.  Fatigue, decreased libido-recheck testosterone level.  We discussed his low end of normal testosterone level from last visit.  We discussed possible causes of fatigue.  We discussed regular exercise, healthy diet  Dermatosis of scalp-he did not tolerate Griseofulvin last visit.  He is using over-the-counter Selsun blue shampoo periodically which seems to be help  Devin Gibbs was seen today for follow-up.  Diagnoses and all orders for this visit:  Elevated serum creatinine -     Basic metabolic panel -     Urinalysis  Elevated red blood cell count -     CBC -     Urinalysis  Other fatigue -     Testosterone  Libido, decreased -     Testosterone  Dermatosis of scalp    Follow up: pending labs

## 2022-06-24 LAB — BASIC METABOLIC PANEL
BUN/Creatinine Ratio: 13 (ref 9–20)
BUN: 13 mg/dL (ref 6–20)
CO2: 22 mmol/L (ref 20–29)
Calcium: 9.6 mg/dL (ref 8.7–10.2)
Chloride: 103 mmol/L (ref 96–106)
Creatinine, Ser: 1.02 mg/dL (ref 0.76–1.27)
Glucose: 104 mg/dL — ABNORMAL HIGH (ref 70–99)
Potassium: 4.4 mmol/L (ref 3.5–5.2)
Sodium: 138 mmol/L (ref 134–144)
eGFR: 100 mL/min/{1.73_m2} (ref >59)

## 2022-06-24 LAB — CBC
Hematocrit: 43.9 % (ref 37.5–51.0)
Hemoglobin: 15.1 g/dL (ref 13.0–17.7)
MCH: 26.3 pg — ABNORMAL LOW (ref 26.6–33.0)
MCHC: 34.4 g/dL (ref 31.5–35.7)
MCV: 76 fL — ABNORMAL LOW (ref 79–97)
Platelets: 196 10*3/uL (ref 150–450)
RBC: 5.75 x10E6/uL (ref 4.14–5.80)
RDW: 14.5 % (ref 11.6–15.4)
WBC: 4.3 10*3/uL (ref 3.4–10.8)

## 2022-06-24 LAB — URINALYSIS
Bilirubin, UA: NEGATIVE
Glucose, UA: NEGATIVE
Ketones, UA: NEGATIVE
Leukocytes,UA: NEGATIVE
Nitrite, UA: NEGATIVE
Protein,UA: NEGATIVE
RBC, UA: NEGATIVE
Specific Gravity, UA: 1.024 (ref 1.005–1.030)
Urobilinogen, Ur: 0.2 mg/dL (ref 0.2–1.0)
pH, UA: 6 (ref 5.0–7.5)

## 2022-06-24 LAB — TESTOSTERONE: Testosterone: 321 ng/dL (ref 264–916)

## 2022-08-20 ENCOUNTER — Encounter: Payer: Self-pay | Admitting: Internal Medicine

## 2022-09-23 ENCOUNTER — Encounter: Payer: Self-pay | Admitting: Internal Medicine

## 2022-12-22 ENCOUNTER — Encounter: Payer: Self-pay | Admitting: Medical

## 2022-12-22 ENCOUNTER — Encounter: Payer: Self-pay | Admitting: Internal Medicine

## 2022-12-22 ENCOUNTER — Ambulatory Visit: Payer: 59 | Admitting: Medical

## 2022-12-22 VITALS — BP 120/68 | HR 95 | Temp 98.4°F | Wt 219.4 lb

## 2022-12-22 DIAGNOSIS — H1031 Unspecified acute conjunctivitis, right eye: Secondary | ICD-10-CM | POA: Diagnosis not present

## 2022-12-22 MED ORDER — POLYMYXIN B-TRIMETHOPRIM 10000-0.1 UNIT/ML-% OP SOLN: 2.0000 [drp] | Freq: Four times a day (QID) | OPHTHALMIC | 0 refills | Status: DC

## 2022-12-22 NOTE — Patient Instructions (Signed)
Your diagnosis today includes: Encounter Diagnosis  Name Primary?   Acute conjunctivitis of right eye, unspecified acute conjunctivitis type Yes     Specific recommendations today include:                                                         Pink eye is very contagious and spreads by direct contact.   You may be given antibiotic eyedrops as part of your treatment. Before using your eye medicine, remove all drainage from the eye by washing gently with warm water and cotton balls.  Continue to use the medication until you have awakened 2 mornings in a row without discharge from the eye.  Do not rub your eye. This increases the irritation and helps spread infection.  Use separate towels from other household members.  Wash your hands with soap and water before and after touching your eyes.  Use cold compresses to reduce pain and sunglasses to relieve irritation from light. Do not wear contact lenses or wear eye makeup until the infection is gone.  Call or return if worse or not seeing improvement in the next few days.   Medication costs:  If you get to the pharmacy and medication prescribed today was either too expensive, not covered by your insurance, or required prior authorization, then please call us back to let us know.  We often have no way to know if a medication is too expensive or not covered by your insurance.  Thanks for your cooperation.   Return if symptoms worsen or fail to improve.    I have included other useful information below for your review.   Bacterial Conjunctivitis Bacterial conjunctivitis (commonly called pink eye) is redness, soreness, or puffiness (inflammation) of the white part of your eye. It is caused by a germ called bacteria. These germs can easily spread from person to person (contagious). Your eye often will become red or pink. Your eye may also become irritated, watery, or have a thick discharge.  HOME CARE  Apply a cool, clean washcloth over closed  eyelids. Do this for 10-20 minutes, 3-4 times a day while you have pain. Gently wipe away any fluid coming from the eye with a warm, wet washcloth or cotton ball. Wash your hands often with soap and water. Use paper towels to dry your hands. Do not share towels or washcloths. Change or wash your pillowcase every day. Do not use eye makeup until the infection is gone. Do not use machines or drive if your vision is blurry. Stop using contact lenses. Do not use them again until your doctor says it is okay. Do not touch the tip of the eye drop bottle or medicine tube with your fingers when you put medicine on the eye. GET HELP RIGHT AWAY IF:  Your eye is not better after 3 days of starting your medicine. You have a yellowish fluid coming out of the eye. You have more pain in the eye. Your eye redness is spreading. Your vision becomes blurry. You have a fever or lasting symptoms for more than 2-3 days. You have a fever and your symptoms suddenly get worse. You have pain in the face. Your face gets red or puffy (swollen). MAKE SURE YOU:  Understand these instructions. Will watch this condition. Will get help right away if  you are not doing well or get worse. Document Released: 09/09/2008 Document Revised: 11/17/2012 Document Reviewed: 08/06/2012 [facility] Patient Information 2015 [place], [place]. This information is not intended to replace advice given to you by your health care provider. Make sure you discuss any questions you have with your health care provider.    Viral Conjunctivitis Conjunctivitis is an irritation (inflammation) of the clear membrane that covers the white part of the eye (the conjunctiva). The irritation can also happen on the underside of the eyelids. Conjunctivitis makes the eye red or pink in color. This is what is commonly known as pink eye. Viral conjunctivitis can spread easily (contagious). CAUSES  Infection from virus on the surface of the eye. Infection from  the irritation or injury of nearby tissues such as the eyelids or cornea. More serious inflammation or infection on the inside of the eye. Other eye diseases. The use of certain eye medications. SYMPTOMS  The normally white color of the eye or the underside of the eyelid is usually pink or red in color. The pink eye is usually associated with irritation, tearing and some sensitivity to light. Viral conjunctivitis is often associated with a clear, watery discharge. If a discharge is present, there may also be some blurred vision in the affected eye. DIAGNOSIS  Conjunctivitis is diagnosed by an eye exam. The eye specialist looks for changes in the surface tissues of the eye which take on changes characteristic of the specific types of conjunctivitis. A sample of any discharge may be collected on a Q-Tip (sterile swap). The sample will be sent to a lab to see whether or not the inflammation is caused by bacterial or viral infection. TREATMENT  Viral conjunctivitis will not respond to medicines that kill germs (antibiotics). Treatment is aimed at stopping a bacterial infection on top of the viral infection. The goal of treatment is to relieve symptoms (such as itching) with antihistamine drops or other eye medications.  HOME CARE INSTRUCTIONS  To ease discomfort, apply a cool, clean wash cloth to your eye for 10 to 20 minutes, 3 to 4 times a day. Gently wipe away any drainage from the eye with a warm, wet washcloth or a cotton ball. Wash your hands often with soap and use paper towels to dry. Do not share towels or washcloths. This may spread the infection. Change or wash your pillowcase every day. You should not use eye make-up until the infection is gone. Stop using contacts lenses. Ask your eye professional how to sterilize or replace them before using again. This depends on the type of contact lenses used. Do not touch the edge of the eyelid with the eye drop bottle or ointment tube when applying  medications to the affected eye. This will stop you from spreading the infection to the other eye or to others. SEEK IMMEDIATE MEDICAL CARE IF:  The infection has not improved within 3 days of beginning treatment. A watery discharge from the eye develops. Pain in the eye increases. The redness is spreading. Vision becomes blurred. An oral temperature above 102 F (38.9 C) develops, or as your caregiver suggests. Facial pain, redness or swelling develops. Any problems that may be related to the prescribed medicine develop. MAKE SURE YOU:  Understand these instructions. Will watch your condition. Will get help right away if you are not doing well or get worse. Document Released: 12/01/2005 Document Revised: 02/23/2012 Document Reviewed: 07/20/2008 [facility] Patient Information 2015 [place], [place]. This information is not intended to replace advice given  to you by your health care provider. Make sure you discuss any questions you have with your health care provider.

## 2022-12-22 NOTE — Progress Notes (Signed)
Subjective:  Devin Gibbs is a 32 y.o. male who presents for Chief Complaint  Patient presents with   Eye Problem    Complains of right eye pain, drainage and redness that started 1 day ago.     Has some concern of possible pink eye x 1 day.  Right eye.  Seeing watery drainage, pus, goopy in the morning, redness.   No sick contacts with pink eye.  Itchy eye.   Eye feels sore.  No prior eye doctor, doesn't wear corrective lenses.  No vision changes. No other aggravating or relieving factors.    No other c/o.  The following portions of the patient's history were reviewed and updated as appropriate: allergies, current medications, past family history, past medical history, past social history, past surgical history and problem list.  ROS Otherwise as in subjective above    Objective: BP 120/68   Pulse 95   Temp 98.4 F (36.9 C) (Oral)   Wt 219 lb 6.4 oz (99.5 kg)   SpO2 96% Comment: room air  BMI 34.36 kg/m   General appearance: alert, no distress, well developed, well nourished HEENT: normocephalic, sclerae anicteric, conjunctiva erythematous with watery discharge on right, crusting in corners of eye, left eye and conjunctiva normal, PERRLA, EOMi    Assessment: Encounter Diagnosis  Name Primary?   Acute conjunctivitis of right eye, unspecified acute conjunctivitis type Yes     Plan: Discussed diagnosis of conjunctivitis/pink eye.  Advised that pink eye is very contagious and spreads by direct contact.  Discussed treatment including moist compresses, avoid rubbing eyes, do not wear contact lenses or makeup until infection is resolved.  Discussed prevention, hand washing, not rubbing eyes.    Patient was advised to call or return if worse or not improving in the next few days.    Patient voiced understanding of diagnosis, recommendations, and treatment plan.    Devin Gibbs was seen today for eye problem.  Diagnoses and all orders for this visit:  Acute conjunctivitis  of right eye, unspecified acute conjunctivitis type  Other orders -     trimethoprim-polymyxin b (POLYTRIM) ophthalmic solution; Place 2 drops into both eyes every 6 (six) hours.   Follow up: prn

## 2023-05-13 ENCOUNTER — Encounter: Payer: Self-pay | Admitting: Internal Medicine

## 2023-05-15 ENCOUNTER — Ambulatory Visit: Payer: 59 | Admitting: Medical

## 2023-05-15 ENCOUNTER — Encounter: Payer: Self-pay | Admitting: Medical

## 2023-05-15 VITALS — BP 128/72 | HR 79 | Temp 98.1°F | Resp 18 | Wt 219.0 lb

## 2023-05-15 DIAGNOSIS — R052 Subacute cough: Secondary | ICD-10-CM | POA: Diagnosis not present

## 2023-05-15 DIAGNOSIS — J029 Acute pharyngitis, unspecified: Secondary | ICD-10-CM

## 2023-05-15 MED ORDER — AMOXICILLIN 875 MG PO TABS: 875.0000 mg | ORAL_TABLET | Freq: Two times a day (BID) | ORAL | 0 refills | Status: AC

## 2023-05-15 NOTE — Progress Notes (Signed)
Subjective:  Devin Gibbs is a 32 y.o. male who presents for Chief Complaint  Patient presents with   Sore Throat    Complains of sore throat and dry cough x 2-3 weeks. Has tried using Chloraseptic spray 2 weeks ago which causes this throat to swell.      Here for sore throat and occasional cough, but still sounds raspy.  Has used some chloraseptic spray but felt like allergic reaction to that.  Took benadryl and stopped chloraseptic.  No runny nose , no sneezing, no itchy or watery eyes, no post nasal drainage, no ear pressure, no sinus pressure or headaches.   Nonsmoker.  No sick contacts.   No heartburn, no reflux.  Eats some spicy foods.  No other aggravating or relieving factors.    No other c/o.  The following portions of the patient's history were reviewed and updated as appropriate: allergies, current medications, past family history, past medical history, past social history, past surgical history and problem list.  ROS Otherwise as in subjective above    Objective: BP 128/72   Pulse 79   Temp 98.1 F (36.7 C) (Oral)   Resp 18   Wt 219 lb (99.3 kg)   SpO2 98% Comment: room air  BMI 34.30 kg/m   General appearance: alert, no distress, well developed, well nourished HEENT: normocephalic, sclerae anicteric, conjunctiva with some mild erythema, TMs pearly, nares with some turbinated edema, mild erythema and no discharge, pharynx with mildly swollen tonsils, mild erythema Oral cavity: MMM, no lesions Neck: supple, no lymphadenopathy, no thyromegaly, no masses Heart: RRR, normal S1, S2, no murmurs Lungs: CTA bilaterally, no wheezes, rhonchi, or rales    Assessment: Encounter Diagnoses  Name Primary?   Sore throat Yes   Subacute cough      Plan: I suspect some component of allergies although he started with a more severe sore throat.   Discussed following recommendations  Patient Instructions  Recommendations: Use salt water gargles throughout the day to  clear mucous Be good about water intake such as 80-100 ounces daily I recommend over the counter Ibuprofen 200mg , use 3 tablets twice daily for the next 4-5 days for pain and inflammation of the tonsils Begin either allergy pill such as zyrtec or benadryl for the next week, or you could try mucinex OTC for the next 5 days Begin Amoxicillin antibiotic twice daily for 10 days If not much improved within 5-7 days, then call or recheck    [name] was seen today for sore throat.  Diagnoses and all orders for this visit:  Sore throat  Subacute cough  Other orders -     amoxicillin (AMOXIL) 875 MG tablet; Take 1 tablet (875 mg total) by mouth 2 (two) times daily for 10 days.    Follow up: soon for yearly fasting well visit

## 2023-05-15 NOTE — Patient Instructions (Signed)
Recommendations: Use salt water gargles throughout the day to clear mucous Be good about water intake such as 80-100 ounces daily I recommend over the counter Ibuprofen 200mg , use 3 tablets twice daily for the next 4-5 days for pain and inflammation of the tonsils Begin either allergy pill such as zyrtec or benadryl for the next week, or you could try mucinex OTC for the next 5 days Begin Amoxicillin antibiotic twice daily for 10 days If not much improved within 5-7 days, then call or recheck

## 2023-08-03 ENCOUNTER — Encounter: Payer: Self-pay | Admitting: Medical

## 2023-08-03 ENCOUNTER — Ambulatory Visit (INDEPENDENT_AMBULATORY_CARE_PROVIDER_SITE_OTHER): Payer: 59 | Admitting: Medical

## 2023-08-03 VITALS — BP 136/80 | HR 90 | Ht 67.0 in | Wt 218.0 lb

## 2023-08-03 DIAGNOSIS — Z113 Encounter for screening for infections with a predominantly sexual mode of transmission: Secondary | ICD-10-CM

## 2023-08-03 DIAGNOSIS — Z13220 Encounter for screening for lipoid disorders: Secondary | ICD-10-CM

## 2023-08-03 DIAGNOSIS — Z0000 Encounter for general adult medical examination without abnormal findings: Secondary | ICD-10-CM | POA: Diagnosis not present

## 2023-08-03 DIAGNOSIS — M25561 Pain in right knee: Secondary | ICD-10-CM | POA: Diagnosis not present

## 2023-08-03 LAB — POCT URINALYSIS DIP (PROADVANTAGE DEVICE)
Bilirubin, UA: NEGATIVE
Blood, UA: NEGATIVE
Glucose, UA: NEGATIVE mg/dL
Ketones, POC UA: NEGATIVE mg/dL
Leukocytes, UA: NEGATIVE
Nitrite, UA: NEGATIVE
Protein Ur, POC: NEGATIVE mg/dL
Specific Gravity, Urine: 1.015
Urobilinogen, Ur: 1
pH, UA: 6 (ref 5.0–8.0)

## 2023-08-03 NOTE — Patient Instructions (Signed)
Patellofemoral Pain Syndrome  Patellofemoral pain syndrome or PFPS is a condition that causes pain in front of the knee and around the kneecap. The kneecap is also called the patella. Your kneecap covers the front of the knee and is attached to muscles above and below the knee. PFPS can be caused by many things. PFPS is most common in active young adults. What are the causes? PFPS may be caused by: Using your knee too much. The smooth tissue that is called cartilage on the underside of the kneecap breaking down. This is called chondromalacia patella or runner's knee. Your knee joint not being aligned well. Weak leg muscles. A hit to your kneecap. What increases the risk? You are more likely to develop PFPS if: You do a lot of activities over and over again that can wear down your kneecap. These include: Running. Squatting. Climbing stairs. You wear shoes that do not fit well. You do not have a lot of leg strength. You are overweight. What are the signs or symptoms? The main symptom of PFPS is knee pain. This may feel like a dull pain under your kneecap. There may be a popping or cracking sound when you move your knee. Pain may get worse when you: Exercise. Climb stairs. Run. Jump. Squat. Kneel. Sit for a long time. Move or push on your kneecap. How is this diagnosed? PFPS may be diagnosed based on: Your symptoms and medical history. You may be asked about your activities and which ones cause knee pain. A physical exam. This may include: Moving your kneecap back and forth. Checking how you move your knee. Having you squat or jump to see if you have pain. Checking the strength of your leg muscles. Imaging tests. These may include an MRI of your knee. How is this treated? PFPS may be treated at home with rest, ice, pressure (compression), and elevation. This is called RICE therapy. Other treatments may include: NSAIDs, such as ibuprofen. Doing physical therapy exercises to  improve movement and strength in your leg. Shoe inserts to take stress off your knee. Sports tape to support the kneecap. Surgery to remove damaged tissue or move the kneecap to a better position. This is rare. Follow these instructions at home: Managing pain, stiffness, and swelling  If told, put ice on the area. Put ice in a plastic bag. Place a towel between your skin and the bag. Leave the ice on for 20 minutes, 2-3 times a day. If your skin turns bright red, take off the ice right away to prevent skin damage. The risk of damage is higher if you can't feel pain, heat, or cold. Move your toes often to reduce stiffness and swelling. Raisethe injured area above the level of your heart while you are sitting or lying down. Activity Rest your knee as told by your health care provider. Avoid activities that cause knee pain. Do stretching and strengthening exercises as told by your provider or physical therapist. Return to your normal activities as told by your provider. Ask your provider what activities are safe for you. General instructions Take over-the-counter and prescription medicines only as told by your provider. Use shoe inserts as told. Put sports tape on your knee as told. Do not use any products that contain nicotine or tobacco. These products include cigarettes, chewing tobacco, and vaping devices, such as e-cigarettes. If you need help quitting, ask your provider. Keep all follow-up visits. Your provider will watch your pain and try other treatments if needed. Contact a  health care provider if: Your symptoms get worse. Your knee pain does not get better with home care. This information is not intended to replace advice given to you by your health care provider. Make sure you discuss any questions you have with your health care provider. Document Revised: 01/29/2023 Document Reviewed: 01/29/2023 Elsevier Patient Education  2024 [employer].

## 2023-08-03 NOTE — Progress Notes (Signed)
Subjective:   HPI  Devin Gibbs is a 32 y.o. male who presents for Chief Complaint  Patient presents with   Annual Exam    Nonfasting annual exam. Has been having some right knee pain x 6 months. Having some eyelid spasms from time to time. Patient would like full STD panel today.    Just added eye and dental coverages, plans to see them.  Having some knee pain.    Expected first child soon.  Reviewed their medical, surgical, family, social, medication, and allergy history and updated chart as appropriate.  No past medical history on file.  Past Surgical History:  Procedure Laterality Date   NO PAST SURGERIES  05/2022    Family History  Problem Relation Age of Onset   Cancer Neg Hx    Heart disease Neg Hx    Hypertension Neg Hx    Stroke Neg Hx    Diabetes Neg Hx     No current outpatient medications on file.  Allergies  Allergen Reactions   Shellfish Allergy Anaphylaxis   Review of Systems  Constitutional:  Negative for chills, fever, malaise/fatigue and weight loss.  HENT:  Negative for congestion, ear pain, hearing loss, sore throat and tinnitus.   Eyes:  Negative for blurred vision, pain and redness.  Respiratory:  Negative for cough, hemoptysis and shortness of breath.   Cardiovascular:  Negative for chest pain, palpitations, orthopnea, claudication and leg swelling.  Gastrointestinal:  Negative for abdominal pain, blood in stool, constipation, diarrhea, nausea and vomiting.  Genitourinary:  Negative for dysuria, flank pain, frequency, hematuria and urgency.  Musculoskeletal:  Positive for joint pain. Negative for falls and myalgias.       Right knee, worse bending, no fall, no injury.   Skin:  Negative for itching and rash.  Neurological:  Negative for dizziness, tingling, speech change, weakness and headaches.  Endo/Heme/Allergies:  Negative for polydipsia. Does not bruise/bleed easily.  Psychiatric/Behavioral:  Negative for depression and memory  loss. The patient is not nervous/anxious and does not have insomnia.         08/03/2023    1:43 PM 05/23/2022    3:15 PM 05/02/2021    1:02 PM  Depression screen PHQ 2/9  Decreased Interest 0 0 0  Down, Depressed, Hopeless 0 0 0  PHQ - 2 Score 0 0 0        Objective:  BP 136/80   Pulse 90   Ht 5\' 7"  (1.702 m)   Wt 218 lb (98.9 kg)   BMI 34.14 kg/m   BP Readings from Last 3 Encounters:  08/03/23 136/80  05/15/23 128/72  12/22/22 120/68   Wt Readings from Last 3 Encounters:  08/03/23 218 lb (98.9 kg)  05/15/23 219 lb (99.3 kg)  12/22/22 219 lb 6.4 oz (99.5 kg)    General appearance: alert, no distress, WD/WN, African American male Skin: unremarkable HEENT: normocephalic, conjunctiva/corneas normal, sclerae anicteric, PERRLA, EOMi, nares patent, no discharge or erythema, pharynx normal Oral cavity: MMM, tongue normal, teeth normal Neck: supple, no lymphadenopathy, no thyromegaly, no masses, normal ROM, no bruits Chest: non tender, normal shape and expansion Heart: RRR, normal S1, S2, no murmurs Lungs: CTA bilaterally, no wheezes, rhonchi, or rales Abdomen: +bs, soft, non tender, non distended, no masses, no hepatomegaly, no splenomegaly, no bruits Back: non tender, normal ROM, no scoliosis Musculoskeletal: knees nontender, no swelling, no deformity, otherwise upper extremities non tender, no obvious deformity, normal ROM throughout, lower extremities non tender, no obvious deformity, normal  ROM throughout Extremities: no edema, no cyanosis, no clubbing Pulses: 2+ symmetric, upper and lower extremities, normal cap refill Neurological: alert, oriented x 3, CN2-12 intact, strength normal upper extremities and lower extremities, sensation normal throughout, DTRs 2+ throughout, no cerebellar signs, gait normal Psychiatric: normal affect, behavior normal, pleasant  GU: normal male external genitalia,circumcised, nontender, no masses, no hernia, no lymphadenopathy Rectal:  Deferred   Assessment and Plan :   Encounter Diagnoses  Name Primary?   Annual physical exam Yes   Screen for STD (sexually transmitted disease)    Screening for lipid disorders    Right knee pain, unspecified chronicity     This visit was a preventative care visit, also known as wellness visit or routine physical.   Topics typically include healthy lifestyle, diet, exercise, preventative care, vaccinations, sick and well care, proper use of emergency dept and after hours care, as well as other concerns.     Recommendations: Continue to return yearly for your annual wellness and preventative care visits.  This gives [place] a chance to discuss healthy lifestyle, exercise, vaccinations, review your chart record, and perform screenings where appropriate.  I recommend you see your eye doctor yearly for routine vision care.  I recommend you see your dentist yearly for routine dental care including hygiene visits twice yearly.   Vaccination recommendations were reviewed Immunization History  Administered Date(s) Administered   PFIZER(Purple Top)SARS-COV-2 Vaccination 07/18/2020, 08/07/2020, 01/19/2021   Tdap 05/02/2021    Advised yearly flu shot in the fall   Screening for cancer: Colon cancer screening: Age 62  Testicular cancer screening You should do a monthly self testicular exam if you are between 62-54 years old  We discussed PSA, prostate exam, and prostate cancer screening risks/benefits.   Age 51  Skin cancer screening: Check your skin regularly for new changes, growing lesions, or other lesions of concern Come in for evaluation if you have skin lesions of concern.  Lung cancer screening: If you have a greater than 20 pack year history of tobacco use, then you may qualify for lung cancer screening with a chest CT scan.   Please call your insurance company to inquire about coverage for this test.  We currently don't have screenings for other cancers besides breast,  cervical, colon, and lung cancers.  If you have a strong family history of cancer or have other cancer screening concerns, please let me know.    Bone health: Get at least 150 minutes of aerobic exercise weekly Get weight bearing exercise at least once weekly Bone density test:  A bone density test is an imaging test that uses a type of X-ray to measure the amount of calcium and other minerals in your bones. The test may be used to diagnose or screen you for a condition that causes weak or thin bones (osteoporosis), predict your risk for a broken bone (fracture), or determine how well your osteoporosis treatment is working. The bone density test is recommended for females 65 and older, or females or males <65 if certain risk factors such as thyroid disease, long term use of steroids such as for asthma or rheumatological issues, vitamin D deficiency, estrogen deficiency, family history of osteoporosis, self or family history of fragility fracture in first degree relative.    Heart health: Get at least 150 minutes of aerobic exercise weekly Limit alcohol It is important to maintain a healthy blood pressure and healthy cholesterol numbers  Heart disease screening: Screening for heart disease includes screening for blood pressure,  fasting lipids, glucose/diabetes screening, BMI height to weight ratio, reviewed of smoking status, physical activity, and diet.    Goals include blood pressure 120/80 or less, maintaining a healthy lipid/cholesterol profile, preventing diabetes or keeping diabetes numbers under good control, not smoking or using tobacco products, exercising most days per week or at least 150 minutes per week of exercise, and eating healthy variety of fruits and vegetables, healthy oils, and avoiding unhealthy food choices like fried food, fast food, high sugar and high cholesterol foods.     Medical care options: I recommend you continue to seek care here first for routine care.  We  try really hard to have available appointments Monday through Friday daytime hours for sick visits, acute visits, and physicals.  Urgent care should be used for after hours and weekends for significant issues that cannot wait till the next day.  The emergency department should be used for significant potentially life-threatening emergencies.  The emergency department is expensive, can often have long wait times for less significant concerns, so try to utilize primary care, urgent care, or telemedicine when possible to avoid unnecessary trips to the emergency department.  Virtual visits and telemedicine have been introduced since the pandemic started in 2020, and can be convenient ways to receive medical care.  We offer virtual appointments as well to assist you in a variety of options to seek medical care.    Separate significant issues discussed: Congratulated him on his upcoming baby.  We discussed vaccines.  Right knee pain, patellofemoral syndrome-discussed home exercises and stretching.  If not improving over the next few weeks let me know and we can pursue physical therapy.      Devin Gibbs was seen today for annual exam.  Diagnoses and all orders for this visit:  Annual physical exam -     POCT Urinalysis DIP (Proadvantage Device) -     Comprehensive metabolic panel -     CBC -     Lipid panel -     HIV Antibody (routine testing w rflx) -     RPR -     Chlamydia/Gonococcus/Trichomonas, NAA -     Hepatitis C antibody -     Hepatitis B surface antigen  Screen for STD (sexually transmitted disease) -     HIV Antibody (routine testing w rflx) -     RPR -     Chlamydia/Gonococcus/Trichomonas, NAA -     Hepatitis C antibody -     Hepatitis B surface antigen  Screening for lipid disorders -     Lipid panel  Right knee pain, unspecified chronicity   Follow-up pending labs, yearly for physical

## 2023-08-04 LAB — COMPREHENSIVE METABOLIC PANEL
ALT: 32 IU/L (ref 0–44)
AST: 18 IU/L (ref 0–40)
Albumin: 4.7 g/dL (ref 4.1–5.1)
Alkaline Phosphatase: 59 IU/L (ref 44–121)
BUN/Creatinine Ratio: 16 (ref 9–20)
BUN: 17 mg/dL (ref 6–20)
Bilirubin Total: 0.3 mg/dL (ref 0.0–1.2)
CO2: 24 mmol/L (ref 20–29)
Calcium: 9.9 mg/dL (ref 8.7–10.2)
Chloride: 102 mmol/L (ref 96–106)
Creatinine, Ser: 1.05 mg/dL (ref 0.76–1.27)
Globulin, Total: 2.8 g/dL (ref 1.5–4.5)
Glucose: 96 mg/dL (ref 70–99)
Potassium: 4.5 mmol/L (ref 3.5–5.2)
Sodium: 138 mmol/L (ref 134–144)
Total Protein: 7.5 g/dL (ref 6.0–8.5)
eGFR: 96 mL/min/{1.73_m2} (ref >59)

## 2023-08-04 LAB — CBC
Hematocrit: 45.3 % (ref 37.5–51.0)
Hemoglobin: 15.2 g/dL (ref 13.0–17.7)
MCH: 25.7 pg — ABNORMAL LOW (ref 26.6–33.0)
MCHC: 33.6 g/dL (ref 31.5–35.7)
MCV: 77 fL — ABNORMAL LOW (ref 79–97)
Platelets: 240 10*3/uL (ref 150–450)
RBC: 5.92 x10E6/uL — ABNORMAL HIGH (ref 4.14–5.80)
RDW: 14.9 % (ref 11.6–15.4)
WBC: 4.9 10*3/uL (ref 3.4–10.8)

## 2023-08-04 LAB — LIPID PANEL
Chol/HDL Ratio: 4.5 ratio (ref 0.0–5.0)
Cholesterol, Total: 188 mg/dL (ref 100–199)
HDL: 42 mg/dL (ref >39)
LDL Chol Calc (NIH): 118 mg/dL — ABNORMAL HIGH (ref 0–99)
Triglycerides: 157 mg/dL — ABNORMAL HIGH (ref 0–149)
VLDL Cholesterol Cal: 28 mg/dL (ref 5–40)

## 2023-08-04 LAB — RPR: RPR Ser Ql: NONREACTIVE

## 2023-08-04 LAB — HEPATITIS C ANTIBODY: Hep C Virus Ab: NONREACTIVE

## 2023-08-04 LAB — HEPATITIS B SURFACE ANTIGEN: Hepatitis B Surface Ag: NEGATIVE

## 2023-08-04 LAB — HIV ANTIBODY (ROUTINE TESTING W REFLEX): HIV Screen 4th Generation wRfx: NONREACTIVE

## 2023-08-04 NOTE — Progress Notes (Signed)
Results sent through MyChart

## 2023-08-05 LAB — CHLAMYDIA/GONOCOCCUS/TRICHOMONAS, NAA
Chlamydia by NAA: NEGATIVE
Gonococcus by NAA: NEGATIVE
Trich vag by NAA: NEGATIVE

## 2023-08-06 NOTE — Progress Notes (Signed)
Results sent through MyChart

## 2024-02-09 ENCOUNTER — Encounter: Payer: Self-pay | Admitting: Medical

## 2024-02-09 ENCOUNTER — Ambulatory Visit: Payer: 59 | Admitting: Medical

## 2024-02-09 VITALS — BP 124/74 | HR 96 | Temp 100.0°F

## 2024-02-09 DIAGNOSIS — M25612 Stiffness of left shoulder, not elsewhere classified: Secondary | ICD-10-CM

## 2024-02-09 DIAGNOSIS — M62838 Other muscle spasm: Secondary | ICD-10-CM

## 2024-02-09 DIAGNOSIS — M25512 Pain in left shoulder: Secondary | ICD-10-CM

## 2024-02-09 DIAGNOSIS — M549 Dorsalgia, unspecified: Secondary | ICD-10-CM | POA: Diagnosis not present

## 2024-02-09 MED ORDER — CYCLOBENZAPRINE HCL 10 MG PO TABS: 10.0000 mg | ORAL_TABLET | Freq: Two times a day (BID) | ORAL | 0 refills | Status: DC | PRN

## 2024-02-09 NOTE — Progress Notes (Signed)
 Subjective:  Devin Gibbs is a 33 y.o. male who presents for Chief Complaint  Patient presents with   other    Started Thursday took a nap when he woke up couldn't really move it much, range of motion was not good,      Here for shoulder pain.  He denies chronic shoulder issues or chronic neck pains.    No injury, no trauma, no fall.  No bruising.  Sometimes pain in left forearm, but mostly in shoulder.  No numbness, no tingling.     He denies any recent strenuous activity at work.   Works 2 jobs, [employer] fundraiser.    He notes awakening from nap 5 days ago and had pain and couldn't move the left shoulder.   Went to urgent care last Friday, had xray, no abnormal bony findings.  Was prescribed flexeril but that hasn't helped a lot.     Using arm sling.  Has tried some ibuprofen the last few days.    No other aggravating or relieving factors.    No other c/o.   No past medical history on file.  Current Outpatient Medications on File Prior to Visit  Medication Sig Dispense Refill   cyclobenzaprine (FLEXERIL) 5 MG tablet Take 5 mg by mouth 3 (three) times daily as needed.     No current facility-administered medications on file prior to visit.   Past Surgical History:  Procedure Laterality Date   NO PAST SURGERIES  05/2022     The following portions of the patient's history were reviewed and updated as appropriate: allergies, current medications, past family history, past medical history, past social history, past surgical history and problem list.  ROS Otherwise as in subjective above   Objective: BP 124/74   Pulse 96   Temp 100 F (37.8 C)   General appearance: alert, no distress, well developed, well nourished, seated in left arm sling Tender over the left shoulder AC joint otherwise nontender to palpation of shoulder and arm Tender left upper back paraspinal region otherwise back nontender Range of motion only of left shoulder reduced in general and when  he tries to lift the arm he uses accessory muscles, elbow and hand range of motion normal but shoulder range of motion is reduced in general.  Active range of motion limited to about 30 degrees in any direction.  Passive range of motion external rotation about 60% of normal, but about 40% of normal with abduction, internal range of motion about 80% of normal, no swelling or deformity or bruising Otherwise arms neurovascularly intact Neck nontender, normal range of motion, no mass, no thyromegaly, no lymphadenopathy     Assessment: Encounter Diagnoses  Name Primary?   Acute pain of left shoulder Yes   Upper back pain    Spasm of muscle    Decreased ROM of left shoulder      Plan: Findings today suggest shoulder bursitis or tendonitis  Recommendations: Use over-the-counter ibuprofen 200 mg, 3 or 4 tablets which would be 600 mg or 800 mg 3 times a day for the next 5 to 7 days.  This is for pain and inflammation. You can continue the Flexeril muscle relaxer 5 mg, 1 or 2 tablets up to twice daily for muscle spasm and tension in the muscle.  Caution this can make you sleepy Do some cold therapy such as ice water or cold pack over-the-counter 20 minutes 3 times a day if possible the next several days to help with inflammation  You could also do an alternating ice and heat where you do cold therapy for 20 minutes then after an hour or so do heat to the upper back, and you can alternate back-and-forth between cold and heat You can continue using the arm sling to rest the shoulder.  Take your arm out of the sling periodically to do a little range of motion activity to avoid frozen shoulder I suspect your symptoms will significantly improve over the next 10 to 14 days However if you do not see a whole lot of improvement by this weekend then let me know Monday and I can refer you to sports medicine   Devin Gibbs was seen today for other.  Diagnoses and all orders for this visit:  Acute pain of left  shoulder  Upper back pain  Spasm of muscle  Decreased ROM of left shoulder  Other orders -     cyclobenzaprine (FLEXERIL) 10 MG tablet; Take 1 tablet (10 mg total) by mouth 2 (two) times daily as needed for muscle spasms.   Follow up: 2 weeks

## 2024-02-09 NOTE — Patient Instructions (Signed)
 Findings today suggest shoulder bursitis or tendonitis  Recommendations: Use over-the-counter ibuprofen 200 mg, 3 or 4 tablets which would be 600 mg or 800 mg 3 times a day for the next 5 to 7 days.  This is for pain and inflammation. You can continue the Flexeril muscle relaxer 5 mg, 1 or 2 tablets up to twice daily for muscle spasm and tension in the muscle.  Caution this can make you sleepy Do some cold therapy such as ice water or cold pack over-the-counter 20 minutes 3 times a day if possible the next several days to help with inflammation You could also do an alternating ice and heat where you do cold therapy for 20 minutes then after an hour or so do heat to the upper back, and you can alternate back-and-forth between cold and heat You can continue using the arm sling to rest the shoulder.  Take your arm out of the sling periodically to do a little range of motion activity to avoid frozen shoulder I suspect your symptoms will significantly improve over the next 10 to 14 days However if you do not see a whole lot of improvement by this weekend then let me know Monday and I can refer you to sports medicine

## 2024-02-19 ENCOUNTER — Ambulatory Visit: Payer: 59 | Admitting: Medical

## 2024-02-19 VITALS — BP 110/64 | HR 89 | Wt 228.0 lb

## 2024-02-19 DIAGNOSIS — M25512 Pain in left shoulder: Secondary | ICD-10-CM | POA: Diagnosis not present

## 2024-02-19 DIAGNOSIS — M549 Dorsalgia, unspecified: Secondary | ICD-10-CM | POA: Diagnosis not present

## 2024-02-19 DIAGNOSIS — M62838 Other muscle spasm: Secondary | ICD-10-CM

## 2024-02-19 MED ORDER — CYCLOBENZAPRINE HCL 10 MG PO TABS: 10.0000 mg | ORAL_TABLET | Freq: Two times a day (BID) | ORAL | 0 refills | Status: DC | PRN

## 2024-02-19 NOTE — Progress Notes (Signed)
 Subjective:  Devin Gibbs is a 33 y.o. male who presents for Chief Complaint  Patient presents with   Follow-up    Follow-up on shoulder and back pain. Doing well      Here for recheck on left shoulder and upper back pain.  I saw him on 02/09/2024 for these issues.  At that time he had awaken with some pain could move his left shoulder, he had had no injury or trauma or fall but he was having pains in the upper back and shoulder could move the shoulder very far.  Last visit he did a combination of recommendations we gave including relative rest, arm sling periodically, range of motion activity, anti-inflammatory over-the-counter and some Flexeril muscle relaxer.  He actually went for a massage a few days ago and that made a big difference as well  He is much improved at this point.  He still has his first physical therapy session scheduled for next week for my referral.  But overall much improved  He is right-handed  No other aggravating or relieving factors.    No other c/o.   No past medical history on file.  No current outpatient medications on file prior to visit.   No current facility-administered medications on file prior to visit.   Past Surgical History:  Procedure Laterality Date   NO PAST SURGERIES  05/2022     The following portions of the patient's history were reviewed and updated as appropriate: allergies, current medications, past family history, past medical history, past social history, past surgical history and problem list.  ROS Otherwise as in subjective above   Objective: BP 110/64   Pulse 89   Wt 228 lb (103.4 kg)   BMI 35.71 kg/m   General appearance: alert, no distress, well developed, well nourished No upper back tenderness today compared to last visit.  Back otherwise unremarkable Only slight tenderness over the left lateral deltoid but otherwise shoulder nontender with normal range of motion.  No swelling or deformity There is still some  mild weakness of the left shoulder with flexion and abduction but otherwise shoulder range of motion strength and sensation normal Arms neurovascularly intact    Assessment: Encounter Diagnoses  Name Primary?   Upper back pain Yes   Muscle spasm    Acute pain of left shoulder       Plan: Much improved compared to last visit.  Continue plan to see physical therapy next week for first visit  Glad he had improvement with recommendations last visit and his recent massage this week.  Recommendations: Continue with stretches and range of motion exercises I demonstrated some strengthening exercises and general range of motion exercises today You can use ibuprofen as needed at this point, over-the-counter dose You can use Flexeril muscle laxer periodically as needed Be mindful of your posture Follow-up with physical therapy this week as planned   Devin Gibbs was seen today for follow-up.  Diagnoses and all orders for this visit:  Upper back pain  Muscle spasm  Acute pain of left shoulder  Other orders -     cyclobenzaprine (FLEXERIL) 10 MG tablet; Take 1 tablet (10 mg total) by mouth 2 (two) times daily as needed for muscle spasms.    Follow up: prn

## 2025-01-20 ENCOUNTER — Ambulatory Visit: Payer: Self-pay | Admitting: Medical

## 2025-01-20 ENCOUNTER — Encounter: Payer: Self-pay | Admitting: Medical

## 2025-01-20 VITALS — BP 118/70 | HR 97 | Ht 66.5 in | Wt 219.0 lb

## 2025-01-20 DIAGNOSIS — Z6834 Body mass index (BMI) 34.0-34.9, adult: Secondary | ICD-10-CM | POA: Insufficient documentation

## 2025-01-20 DIAGNOSIS — F4321 Adjustment disorder with depressed mood: Secondary | ICD-10-CM | POA: Insufficient documentation

## 2025-01-20 DIAGNOSIS — Z131 Encounter for screening for diabetes mellitus: Secondary | ICD-10-CM

## 2025-01-20 DIAGNOSIS — Z13220 Encounter for screening for lipoid disorders: Secondary | ICD-10-CM

## 2025-01-20 DIAGNOSIS — Z0000 Encounter for general adult medical examination without abnormal findings: Secondary | ICD-10-CM

## 2025-01-20 DIAGNOSIS — R635 Abnormal weight gain: Secondary | ICD-10-CM

## 2025-01-20 DIAGNOSIS — Z113 Encounter for screening for infections with a predominantly sexual mode of transmission: Secondary | ICD-10-CM

## 2025-01-20 DIAGNOSIS — Z2820 Immunization not carried out because of patient decision for unspecified reason: Secondary | ICD-10-CM

## 2025-01-20 LAB — LIPID PANEL

## 2025-01-20 LAB — URINALYSIS, ROUTINE W REFLEX MICROSCOPIC

## 2025-01-20 LAB — COMPREHENSIVE METABOLIC PANEL WITH GFR

## 2025-01-20 LAB — CBC
Hematocrit: 46.3 % (ref 37.5–51.0)
Hemoglobin: 15.1 g/dL (ref 13.0–17.7)
MCH: 25.3 pg — ABNORMAL LOW (ref 26.6–33.0)
MCHC: 32.6 g/dL (ref 31.5–35.7)
MCV: 77 fL — ABNORMAL LOW (ref 79–97)
Platelets: 199 10*3/uL (ref 150–450)
RBC: 5.98 x10E6/uL — ABNORMAL HIGH (ref 4.14–5.80)
RDW: 15.7 % — ABNORMAL HIGH (ref 11.6–15.4)
WBC: 3.4 10*3/uL (ref 3.4–10.8)

## 2025-01-20 LAB — HIV ANTIBODY (ROUTINE TESTING W REFLEX)

## 2025-01-20 LAB — HEMOGLOBIN A1C

## 2025-01-20 LAB — TSH+FREE T4

## 2025-01-20 LAB — HEPATITIS B SURFACE ANTIGEN

## 2025-01-20 LAB — HSV 1 AND 2 AB, IGG

## 2025-01-20 LAB — SYPHILIS: RPR W/REFLEX TO RPR TITER AND TREPONEMAL ANTIBODIES, TRADITIONAL SCREENING AND DIAGNOSIS ALGORITHM

## 2025-01-20 LAB — HEPATITIS C ANTIBODY

## 2025-01-20 NOTE — Progress Notes (Signed)
 "  Name: Devin Gibbs   Date of Visit: 01/20/25   CHIEF COMPLAINT:  Chief Complaint  Patient presents with   Annual Exam    Fasting cpe,PQ-9 abnormal. No other concerns. Declines vaccines today       HPI:  Discussed the use of AI scribe software for clinical note transcription with the patient, who gave verbal consent to proceed.  History of Present Illness  Devin Gibbs is a 34 year old male who presents for a well visit and reports mental health struggles following a breakup.  He is experiencing mental health struggles following a breakup at the end of November. He has not seen a counselor recently but has participated in relationship counseling in the past. He wants to return to counseling but finds it challenging to find time. He has a history of taking Paxil, which he discontinued due to erectile dysfunction.  He uses melatonin for sleep, which he has been taking for about a week. It helps him fall asleep but not stay asleep, as he often wakes up around 1:30 AM. He has previously experienced a 'pill hangover' with melatonin but not with his current regimen.  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 He acknowledges not getting as much exercise as he needs and has cut back on alcohol consumption. He takes magnesium, melatonin, and zinc supplements.  He wants to be screened for STDs, including HIV and herpes, and is open to routine labs. He has no significant prior diagnoses or surgeries. He has one child.  He did not have a physical last year.  Reviewed their medical, surgical, family, social, medication, and allergy history and updated chart as appropriate.   Allergies[1]  History reviewed. No pertinent past medical history.  Current Outpatient Medications:    Ashwagandha 300 MG TABS, Take by mouth., Disp: , Rfl:    MAGNESIUM ASPARTATE PO, Take 500 mg by mouth daily., Disp: , Rfl:    Melatonin 10 MG CAPS, Take by mouth., Disp: , Rfl:     Zinc Acetate, Oral, (ZINC ACETATE PO), Take 50 mg by mouth daily., Disp: , Rfl:   Family History  Problem Relation Age of Onset   Cancer Neg Hx    Heart disease Neg Hx    Hypertension Neg Hx    Stroke Neg Hx    Diabetes Neg Hx     Past Surgical History:  Procedure Laterality Date   NO PAST SURGERIES  01/2025     Review of Systems  Constitutional:  Negative for chills, fever, malaise/fatigue and weight loss.  HENT:  Negative for congestion, ear pain, hearing loss, sore throat and tinnitus.   Eyes:  Negative for blurred vision, pain and redness.  Respiratory:  Negative for cough, hemoptysis and shortness of breath.   Cardiovascular:  Negative for chest pain, palpitations, orthopnea, claudication and leg swelling.  Gastrointestinal:  Negative for abdominal pain, blood in stool, constipation, diarrhea, nausea and vomiting.  Genitourinary:  Negative for dysuria, flank pain, frequency, hematuria and urgency.  Musculoskeletal:  Negative for falls, joint pain and myalgias.  Skin:  Negative for itching and rash.  Neurological:  Negative for dizziness, tingling, speech change, weakness and headaches.  Endo/Heme/Allergies:  Negative for polydipsia. Does not bruise/bleed easily.  Psychiatric/Behavioral:  Positive for depression. Negative for memory loss. The patient is not nervous/anxious and does not have insomnia.      OBJECTIVE:    BP 118/70   Pulse 97   Ht 5'  6.5 (1.689 m)   Wt 219 lb (99.3 kg)   SpO2 97%   BMI 34.82 kg/m   BP Readings from Last 3 Encounters:  01/20/25 118/70  02/19/24 110/64  02/09/24 124/74   Wt Readings from Last 3 Encounters:  01/20/25 219 lb (99.3 kg)  02/19/24 228 lb (103.4 kg)  08/03/23 218 lb (98.9 kg)    General appearance: alert, no distress, WD/WN, African American male Skin: unremarkable HEENT: normocephalic, conjunctiva/corneas normal, sclerae anicteric, PERRLA, EOMi, nares patent, no discharge or erythema, pharynx normal Oral cavity:  MMM, tongue normal, some decay noted upper and lower Neck: supple, no lymphadenopathy, no thyromegaly, no masses, normal ROM, no bruits Chest: non tender, normal shape and expansion Heart: RRR, normal S1, S2, no murmurs Lungs: CTA bilaterally, no wheezes, rhonchi, or rales Abdomen: +bs, soft, non tender, non distended, no masses, no hepatomegaly, no splenomegaly, no bruits Back: non tender, normal ROM, no scoliosis Musculoskeletal: upper extremities non tender, no obvious deformity, normal ROM throughout, lower extremities non tender, no obvious deformity, normal ROM throughout Extremities: no edema, no cyanosis, no clubbing Pulses: 2+ symmetric, upper and lower extremities, normal cap refill Neurological: alert, oriented x 3, CN2-12 intact, strength normal upper extremities and lower extremities, sensation normal throughout, DTRs 2+ throughout, no cerebellar signs, gait normal Psychiatric: normal affect, behavior normal, pleasant  GU: normal male external genitalia,circumcised, nontender, no masses, no hernia, no lymphadenopathy Rectal: declined    ASSESSMENT/PLAN:   Encounter Diagnoses  Name Primary?   Annual physical exam Yes   Vaccine refused by patient    Screen for STD (sexually transmitted disease)    Screening for lipid disorders    Screening for diabetes mellitus    Situational depression    Weight gain    BMI 34.0-34.9,adult     Adult Wellness Visit Routine wellness visit with several health concerns discussed. Blood pressure controlled. - Ordered routine labs: liver, kidney, blood count, cholesterol, diabetes screen, thyroid, urine analysis. - Ordered STD screening: HIV, syphilis, gonorrhea, chlamydia, hepatitis B and C. - Encouraged annual dental and eye exams. - Recommended flu vaccination in fall. - Encouraged regular exercise, healthy diet, adequate sleep.  Adjustment disorder with depressed mood Discussed Wellbutrin for mood improvement without sexual side  effects. Encouraged counseling and healthy coping mechanisms. Discussed sleep aids if melatonin fails. - Encouraged counseling through employee assistance program or private counseling. - Discussed potential use of Wellbutrin for mood if needed. - Consider sleep aids like temazepam or trazodone if melatonin is ineffective. - Encouraged healthy coping mechanisms including exercise, healthy diet, and community involvement.  Bmi 34 -work on efforts to lose weight through healthy diet and exericse   Medical care options: I recommend you continue to seek care here first for routine care.  We try really hard to have available appointments Monday through Friday daytime hours for sick visits, acute visits, and physicals.  Urgent care should be used for after hours and weekends for significant issues that cannot wait till the next day.  The emergency department should be used for significant potentially life-threatening emergencies.  The emergency department is expensive, can often have long wait times for less significant concerns, so try to utilize primary care, urgent care, or telemedicine when possible to avoid unnecessary trips to the emergency department.  Virtual visits and telemedicine have been introduced since the pandemic started in 2020, and can be convenient ways to receive medical care.  We offer virtual appointments as well to assist you in a variety of  options to seek medical care.   Legal  Take the time to do a last will and testament, Advanced Directives including Health Care Power of Attorney and Living Will documents.  Don't leave your family with burdens that can be handled ahead of time.   Advanced Directives: I recommend you consider completing a Health Care Power of Attorney and Living Will.   These documents respect your wishes and help alleviate burdens on your loved ones if you were to become terminally ill or be in a position to need those documents enforced.    You can  complete Advanced Directives yourself, have them notarized, then have copies made for our office, for you and for anybody you feel should have them in safe keeping.  Or, you can have an attorney prepare these documents.   If you haven't updated your Last Will and Testament in a while, it may be worthwhile having an attorney prepare these documents together and save on some costs.      Ranier was seen today for annual exam.  Diagnoses and all orders for this visit:  Annual physical exam -     CBC -     Comprehensive metabolic panel with GFR -     Lipid panel -     TSH + free T4 -     Hemoglobin A1c -     Urinalysis, Routine w reflex microscopic -     HIV Antibody (routine testing w rflx) -     RPR W/RFLX TO RPR TITER, TREPONEMAL AB, SCREEN AND DIAGNOSIS -     Chlamydia/Gonococcus/Trichomonas, NAA -     HSV 1 and 2 Ab, IgG -     Hepatitis C antibody -     Hepatitis B surface antigen  Vaccine refused by patient  Screen for STD (sexually transmitted disease) -     HIV Antibody (routine testing w rflx) -     RPR W/RFLX TO RPR TITER, TREPONEMAL AB, SCREEN AND DIAGNOSIS -     Chlamydia/Gonococcus/Trichomonas, NAA -     Hepatitis C antibody -     Hepatitis B surface antigen  Screening for lipid disorders -     Lipid panel  Screening for diabetes mellitus -     Hemoglobin A1c  Situational depression -     TSH + free T4  Weight gain -     TSH + free T4  BMI 34.0-34.9,adult    I recommend follow up yearly for a routine physical.  [facility]      [1]  Allergies Allergen Reactions   Shellfish Allergy Anaphylaxis   "

## 2026-01-22 ENCOUNTER — Encounter: Admitting: Medical
